# Patient Record
Sex: Female | Born: 1955 | Race: White | Hispanic: No | Smoking: Never smoker
Health system: Southern US, Community
[De-identification: ages and names within clinical notes are randomized; demographics above are authoritative.]

## PROBLEM LIST (undated history)

## (undated) DIAGNOSIS — I10 Essential (primary) hypertension: Secondary | ICD-10-CM

## (undated) DIAGNOSIS — E119 Type 2 diabetes mellitus without complications: Secondary | ICD-10-CM

---

## 1998-06-05 ENCOUNTER — Encounter: Payer: Self-pay | Admitting: Gynecology

## 1998-06-05 ENCOUNTER — Ambulatory Visit (HOSPITAL_COMMUNITY): Admission: RE | Admit: 1998-06-05 | Discharge: 1998-06-05 | Payer: Self-pay | Admitting: Gynecology

## 1999-01-21 ENCOUNTER — Ambulatory Visit (HOSPITAL_COMMUNITY): Admission: RE | Admit: 1999-01-21 | Discharge: 1999-01-21 | Payer: Self-pay | Admitting: Psychiatry

## 1999-02-02 ENCOUNTER — Ambulatory Visit (HOSPITAL_COMMUNITY): Admission: RE | Admit: 1999-02-02 | Discharge: 1999-02-02 | Payer: Self-pay | Admitting: Psychiatry

## 1999-02-04 ENCOUNTER — Ambulatory Visit (HOSPITAL_COMMUNITY): Admission: RE | Admit: 1999-02-04 | Discharge: 1999-02-04 | Payer: Self-pay | Admitting: Psychiatry

## 1999-02-11 ENCOUNTER — Ambulatory Visit (HOSPITAL_COMMUNITY): Admission: RE | Admit: 1999-02-11 | Discharge: 1999-02-11 | Payer: Self-pay | Admitting: Psychiatry

## 1999-03-04 ENCOUNTER — Inpatient Hospital Stay (HOSPITAL_COMMUNITY): Admission: RE | Admit: 1999-03-04 | Discharge: 1999-03-05 | Payer: Self-pay | Admitting: Neurosurgery

## 1999-03-04 ENCOUNTER — Encounter: Payer: Self-pay | Admitting: Neurosurgery

## 1999-06-09 ENCOUNTER — Ambulatory Visit (HOSPITAL_COMMUNITY): Admission: RE | Admit: 1999-06-09 | Discharge: 1999-06-09 | Payer: Self-pay | Admitting: Neurosurgery

## 1999-06-09 ENCOUNTER — Encounter: Payer: Self-pay | Admitting: Neurosurgery

## 2000-11-14 ENCOUNTER — Other Ambulatory Visit: Admission: RE | Admit: 2000-11-14 | Discharge: 2000-11-14 | Payer: Self-pay | Admitting: Gynecology

## 2013-12-05 ENCOUNTER — Encounter (HOSPITAL_COMMUNITY)
Admission: EM | Disposition: E | Payer: Medicare Other | Source: Other Acute Inpatient Hospital | Attending: Internal Medicine

## 2013-12-05 ENCOUNTER — Encounter (HOSPITAL_COMMUNITY): Payer: Self-pay | Admitting: Adult Health

## 2013-12-05 ENCOUNTER — Inpatient Hospital Stay (HOSPITAL_COMMUNITY): Payer: Medicare Other

## 2013-12-05 ENCOUNTER — Other Ambulatory Visit: Payer: Self-pay

## 2013-12-05 DIAGNOSIS — J9600 Acute respiratory failure, unspecified whether with hypoxia or hypercapnia: Secondary | ICD-10-CM | POA: Diagnosis not present

## 2013-12-05 DIAGNOSIS — R9431 Abnormal electrocardiogram [ECG] [EKG]: Secondary | ICD-10-CM

## 2013-12-05 DIAGNOSIS — E872 Acidosis, unspecified: Secondary | ICD-10-CM | POA: Diagnosis present

## 2013-12-05 DIAGNOSIS — Z66 Do not resuscitate: Secondary | ICD-10-CM | POA: Diagnosis present

## 2013-12-05 DIAGNOSIS — I214 Non-ST elevation (NSTEMI) myocardial infarction: Principal | ICD-10-CM

## 2013-12-05 DIAGNOSIS — A419 Sepsis, unspecified organism: Secondary | ICD-10-CM | POA: Diagnosis not present

## 2013-12-05 DIAGNOSIS — J690 Pneumonitis due to inhalation of food and vomit: Secondary | ICD-10-CM | POA: Diagnosis present

## 2013-12-05 DIAGNOSIS — G931 Anoxic brain damage, not elsewhere classified: Secondary | ICD-10-CM | POA: Diagnosis present

## 2013-12-05 DIAGNOSIS — E876 Hypokalemia: Secondary | ICD-10-CM | POA: Diagnosis present

## 2013-12-05 DIAGNOSIS — R6521 Severe sepsis with septic shock: Secondary | ICD-10-CM

## 2013-12-05 DIAGNOSIS — G9340 Encephalopathy, unspecified: Secondary | ICD-10-CM | POA: Diagnosis present

## 2013-12-05 DIAGNOSIS — Z79899 Other long term (current) drug therapy: Secondary | ICD-10-CM

## 2013-12-05 DIAGNOSIS — E870 Hyperosmolality and hypernatremia: Secondary | ICD-10-CM | POA: Diagnosis present

## 2013-12-05 DIAGNOSIS — J9601 Acute respiratory failure with hypoxia: Secondary | ICD-10-CM | POA: Diagnosis present

## 2013-12-05 DIAGNOSIS — I469 Cardiac arrest, cause unspecified: Secondary | ICD-10-CM

## 2013-12-05 DIAGNOSIS — G9382 Brain death: Secondary | ICD-10-CM | POA: Diagnosis present

## 2013-12-05 DIAGNOSIS — N179 Acute kidney failure, unspecified: Secondary | ICD-10-CM | POA: Diagnosis present

## 2013-12-05 DIAGNOSIS — I472 Ventricular tachycardia, unspecified: Secondary | ICD-10-CM | POA: Diagnosis present

## 2013-12-05 DIAGNOSIS — IMO0001 Reserved for inherently not codable concepts without codable children: Secondary | ICD-10-CM | POA: Diagnosis present

## 2013-12-05 DIAGNOSIS — E1165 Type 2 diabetes mellitus with hyperglycemia: Secondary | ICD-10-CM

## 2013-12-05 DIAGNOSIS — I4729 Other ventricular tachycardia: Secondary | ICD-10-CM | POA: Diagnosis present

## 2013-12-05 DIAGNOSIS — R570 Cardiogenic shock: Secondary | ICD-10-CM | POA: Diagnosis present

## 2013-12-05 DIAGNOSIS — I2510 Atherosclerotic heart disease of native coronary artery without angina pectoris: Secondary | ICD-10-CM | POA: Diagnosis present

## 2013-12-05 DIAGNOSIS — I10 Essential (primary) hypertension: Secondary | ICD-10-CM | POA: Diagnosis present

## 2013-12-05 HISTORY — PX: LEFT HEART CATHETERIZATION WITH CORONARY ANGIOGRAM: SHX5451

## 2013-12-05 HISTORY — DX: Type 2 diabetes mellitus without complications: E11.9

## 2013-12-05 HISTORY — DX: Essential (primary) hypertension: I10

## 2013-12-05 LAB — GLUCOSE, CAPILLARY
GLUCOSE-CAPILLARY: 430 mg/dL — AB (ref 70–99)
GLUCOSE-CAPILLARY: 417 mg/dL — AB (ref 70–99)
Glucose-Capillary: 404 mg/dL — ABNORMAL HIGH (ref 70–99)
Glucose-Capillary: 390 mg/dL — ABNORMAL HIGH (ref 70–99)
Glucose-Capillary: 458 mg/dL — ABNORMAL HIGH (ref 70–99)
Glucose-Capillary: 426 mg/dL — ABNORMAL HIGH (ref 70–99)
Glucose-Capillary: 380 mg/dL — ABNORMAL HIGH (ref 70–99)
Glucose-Capillary: 345 mg/dL — ABNORMAL HIGH (ref 70–99)
Glucose-Capillary: 281 mg/dL — ABNORMAL HIGH (ref 70–99)
Glucose-Capillary: 267 mg/dL — ABNORMAL HIGH (ref 70–99)

## 2013-12-05 LAB — BASIC METABOLIC PANEL
Anion gap: 32 — ABNORMAL HIGH (ref 5–15)
Anion gap: 31 — ABNORMAL HIGH (ref 5–15)
Anion gap: 27 — ABNORMAL HIGH (ref 5–15)
BUN: 14 mg/dL (ref 6–23)
BUN: 13 mg/dL (ref 6–23)
BUN: 13 mg/dL (ref 6–23)
CHLORIDE: 106 meq/L (ref 96–112)
CHLORIDE: 110 meq/L (ref 96–112)
CO2: 12 meq/L — AB (ref 19–32)
CO2: 11 meq/L — AB (ref 19–32)
CO2: 12 meq/L — AB (ref 19–32)
Calcium: 7.3 mg/dL — ABNORMAL LOW (ref 8.4–10.5)
Calcium: 6.9 mg/dL — ABNORMAL LOW (ref 8.4–10.5)
Calcium: 6.9 mg/dL — ABNORMAL LOW (ref 8.4–10.5)
Chloride: 104 mEq/L (ref 96–112)
Creatinine, Ser: 1.07 mg/dL (ref 0.50–1.10)
Creatinine, Ser: 1.07 mg/dL (ref 0.50–1.10)
Creatinine, Ser: 1.09 mg/dL (ref 0.50–1.10)
GFR calc Af Amer: 66 mL/min — ABNORMAL LOW (ref >90)
GFR calc Af Amer: 66 mL/min — ABNORMAL LOW (ref >90)
GFR calc Af Amer: 65 mL/min — ABNORMAL LOW (ref >90)
GFR calc non Af Amer: 57 mL/min — ABNORMAL LOW (ref >90)
GFR calc non Af Amer: 57 mL/min — ABNORMAL LOW (ref >90)
GFR calc non Af Amer: 56 mL/min — ABNORMAL LOW (ref >90)
GLUCOSE: 509 mg/dL — AB (ref 70–99)
GLUCOSE: 443 mg/dL — AB (ref 70–99)
GLUCOSE: 346 mg/dL — AB (ref 70–99)
POTASSIUM: 2.8 meq/L — AB (ref 3.7–5.3)
POTASSIUM: 3 meq/L — AB (ref 3.7–5.3)
POTASSIUM: 3.3 meq/L — AB (ref 3.7–5.3)
SODIUM: 148 meq/L — AB (ref 137–147)
SODIUM: 148 meq/L — AB (ref 137–147)
SODIUM: 149 meq/L — AB (ref 137–147)

## 2013-12-05 LAB — URINALYSIS, ROUTINE W REFLEX MICROSCOPIC
Bilirubin Urine: NEGATIVE
Glucose, UA: >1000 mg/dL — AB
Ketones, ur: 15 mg/dL — AB
Leukocytes, UA: NEGATIVE
NITRITE: NEGATIVE
Protein, ur: NEGATIVE mg/dL
Specific Gravity, Urine: 1.017 (ref 1.005–1.030)
UROBILINOGEN UA: 0.2 mg/dL (ref 0.0–1.0)
pH: 5.5 (ref 5.0–8.0)

## 2013-12-05 LAB — TROPONIN I
Troponin I: >20 ng/mL (ref <0.30)
Troponin I: >20 ng/mL (ref <0.30)

## 2013-12-05 LAB — COMPREHENSIVE METABOLIC PANEL
ALT: 240 U/L — AB (ref 0–35)
ANION GAP: 27 — AB (ref 5–15)
AST: 541 U/L — ABNORMAL HIGH (ref 0–37)
Albumin: 3.3 g/dL — ABNORMAL LOW (ref 3.5–5.2)
Alkaline Phosphatase: 148 U/L — ABNORMAL HIGH (ref 39–117)
BUN: 13 mg/dL (ref 6–23)
CALCIUM: 6.9 mg/dL — AB (ref 8.4–10.5)
CO2: 14 mEq/L — ABNORMAL LOW (ref 19–32)
Chloride: 104 mEq/L (ref 96–112)
Creatinine, Ser: 1.07 mg/dL (ref 0.50–1.10)
GFR calc Af Amer: 66 mL/min — ABNORMAL LOW (ref >90)
GFR calc non Af Amer: 57 mL/min — ABNORMAL LOW (ref >90)
GLUCOSE: 455 mg/dL — AB (ref 70–99)
POTASSIUM: 3 meq/L — AB (ref 3.7–5.3)
SODIUM: 145 meq/L (ref 137–147)
TOTAL PROTEIN: 5.6 g/dL — AB (ref 6.0–8.3)
Total Bilirubin: 0.4 mg/dL (ref 0.3–1.2)

## 2013-12-05 LAB — CBC
HCT: 48.8 % — ABNORMAL HIGH (ref 36.0–46.0)
HEMOGLOBIN: 16.4 g/dL — AB (ref 12.0–15.0)
MCH: 32.2 pg (ref 26.0–34.0)
MCHC: 33.6 g/dL (ref 30.0–36.0)
MCV: 95.7 fL (ref 78.0–100.0)
Platelets: 225 10*3/uL (ref 150–400)
RBC: 5.1 MIL/uL (ref 3.87–5.11)
RDW: 12.6 % (ref 11.5–15.5)
WBC: 25.8 10*3/uL — ABNORMAL HIGH (ref 4.0–10.5)

## 2013-12-05 LAB — RAPID URINE DRUG SCREEN, HOSP PERFORMED
AMPHETAMINES: NOT DETECTED
BENZODIAZEPINES: POSITIVE — AB
Barbiturates: NOT DETECTED
Cocaine: NOT DETECTED
OPIATES: NOT DETECTED
TETRAHYDROCANNABINOL: NOT DETECTED

## 2013-12-05 LAB — URINE MICROSCOPIC-ADD ON

## 2013-12-05 LAB — POCT I-STAT 3, ART BLOOD GAS (G3+)
ACID-BASE DEFICIT: 10 mmol/L — AB (ref 0.0–2.0)
Bicarbonate: 15.9 mEq/L — ABNORMAL LOW (ref 20.0–24.0)
O2 Saturation: 100 %
TCO2: 17 mmol/L (ref 0–100)
pCO2 arterial: 33.7 mmHg — ABNORMAL LOW (ref 35.0–45.0)
pH, Arterial: 7.282 — ABNORMAL LOW (ref 7.350–7.450)
pO2, Arterial: 267 mmHg — ABNORMAL HIGH (ref 80.0–100.0)

## 2013-12-05 LAB — PROTIME-INR
INR: 1.45 (ref 0.00–1.49)
INR: 1.32 (ref 0.00–1.49)
PROTHROMBIN TIME: 16.4 s — AB (ref 11.6–15.2)
Prothrombin Time: 17.6 seconds — ABNORMAL HIGH (ref 11.6–15.2)

## 2013-12-05 LAB — PHOSPHORUS: PHOSPHORUS: 2.6 mg/dL (ref 2.3–4.6)

## 2013-12-05 LAB — MRSA PCR SCREENING: MRSA BY PCR: NEGATIVE

## 2013-12-05 LAB — APTT
APTT: 134 s — AB (ref 24–37)
APTT: 81 s — AB (ref 24–37)

## 2013-12-05 LAB — LACTIC ACID, PLASMA
LACTIC ACID, VENOUS: 9.7 mmol/L — AB (ref 0.5–2.2)
Lactic Acid, Venous: 12.2 mmol/L — ABNORMAL HIGH (ref 0.5–2.2)

## 2013-12-05 LAB — MAGNESIUM: Magnesium: 2.2 mg/dL (ref 1.5–2.5)

## 2013-12-05 LAB — HEMOGLOBIN A1C
Hgb A1c MFr Bld: 6.8 % — ABNORMAL HIGH (ref <5.7)
Mean Plasma Glucose: 148 mg/dL — ABNORMAL HIGH (ref <117)

## 2013-12-05 LAB — HEPARIN LEVEL (UNFRACTIONATED): Heparin Unfractionated: 0.57 IU/mL (ref 0.30–0.70)

## 2013-12-05 LAB — PRO B NATRIURETIC PEPTIDE: PRO B NATRI PEPTIDE: 990.1 pg/mL — AB (ref 0–125)

## 2013-12-05 SURGERY — LEFT HEART CATHETERIZATION WITH CORONARY ANGIOGRAM
Anesthesia: LOCAL

## 2013-12-05 MED ORDER — INSULIN ASPART 100 UNIT/ML ~~LOC~~ SOLN: 0.0000 [IU] | SUBCUTANEOUS | Status: DC

## 2013-12-05 MED ORDER — POTASSIUM CHLORIDE 20 MEQ/15ML (10%) PO LIQD
40.0000 meq | Freq: Once | ORAL | Status: AC
  Administered 2013-12-05: 40 meq via ORAL
  Filled 2013-12-05: qty 30

## 2013-12-05 MED ORDER — BIOTENE DRY MOUTH MT LIQD
15.0000 mL | Freq: Four times a day (QID) | OROMUCOSAL | Status: DC
  Administered 2013-12-05 – 2013-12-07 (×8): 15 mL via OROMUCOSAL

## 2013-12-05 MED ORDER — TICAGRELOR 90 MG PO TABS
180.0000 mg | ORAL_TABLET | Freq: Once | ORAL | Status: AC
  Administered 2013-12-05: 180 mg via ORAL
  Filled 2013-12-05: qty 2

## 2013-12-05 MED ORDER — HEPARIN SODIUM (PORCINE) 1000 UNIT/ML IJ SOLN
4000.0000 [IU] | Freq: Once | INTRAMUSCULAR | Status: AC
  Administered 2013-12-05: 4000 [IU] via INTRAVENOUS
  Filled 2013-12-05: qty 4

## 2013-12-05 MED ORDER — SODIUM CHLORIDE 0.9 % IV SOLN
INTRAVENOUS | Status: DC
  Administered 2013-12-05: 12:00:00 via INTRAVENOUS

## 2013-12-05 MED ORDER — NOREPINEPHRINE BITARTRATE 1 MG/ML IV SOLN
0.5000 ug/min | INTRAVENOUS | Status: DC
  Administered 2013-12-05: 15 ug/min via INTRAVENOUS
  Administered 2013-12-06: 5 ug/min via INTRAVENOUS
  Administered 2013-12-06 – 2013-12-07 (×3): 50 ug/min via INTRAVENOUS
  Filled 2013-12-05 (×6): qty 16

## 2013-12-05 MED ORDER — SODIUM CHLORIDE 0.9 % IV SOLN
25.0000 ug/h | INTRAVENOUS | Status: DC
  Administered 2013-12-05: 50 ug/h via INTRAVENOUS
  Filled 2013-12-05: qty 50

## 2013-12-05 MED ORDER — SODIUM CHLORIDE 0.9 % IV SOLN
INTRAVENOUS | Status: DC
  Administered 2013-12-05: 17:00:00 via INTRAVENOUS

## 2013-12-05 MED ORDER — ASPIRIN 300 MG RE SUPP
300.0000 mg | RECTAL | Status: DC
  Filled 2013-12-05: qty 1

## 2013-12-05 MED ORDER — POTASSIUM CHLORIDE 10 MEQ/50ML IV SOLN
10.0000 meq | INTRAVENOUS | Status: AC
  Administered 2013-12-05 (×4): 10 meq via INTRAVENOUS
  Filled 2013-12-05 (×4): qty 50

## 2013-12-05 MED ORDER — ASPIRIN 81 MG PO CHEW
324.0000 mg | CHEWABLE_TABLET | Freq: Once | ORAL | Status: AC
  Administered 2013-12-05: 324 mg
  Filled 2013-12-05: qty 4

## 2013-12-05 MED ORDER — VANCOMYCIN HCL 10 G IV SOLR
2000.0000 mg | Freq: Once | INTRAVENOUS | Status: AC
  Administered 2013-12-05: 2000 mg via INTRAVENOUS
  Filled 2013-12-05: qty 2000

## 2013-12-05 MED ORDER — CHLORHEXIDINE GLUCONATE 0.12 % MT SOLN
15.0000 mL | Freq: Two times a day (BID) | OROMUCOSAL | Status: DC
  Administered 2013-12-05 – 2013-12-07 (×5): 15 mL via OROMUCOSAL
  Filled 2013-12-05 (×5): qty 15

## 2013-12-05 MED ORDER — FENTANYL BOLUS VIA INFUSION
50.0000 ug | INTRAVENOUS | Status: DC | PRN
  Filled 2013-12-05: qty 50

## 2013-12-05 MED ORDER — FENTANYL CITRATE 0.05 MG/ML IJ SOLN: 100.0000 ug | Freq: Once | INTRAMUSCULAR | Status: DC

## 2013-12-05 MED ORDER — NOREPINEPHRINE BITARTRATE 1 MG/ML IV SOLN
0.5000 ug/min | INTRAVENOUS | Status: DC
  Administered 2013-12-05: 15 ug/min via INTRAVENOUS
  Filled 2013-12-05: qty 4

## 2013-12-05 MED ORDER — MIDAZOLAM HCL 5 MG/ML IJ SOLN: 2.0000 mg | Freq: Once | INTRAMUSCULAR | Status: DC

## 2013-12-05 MED ORDER — ASPIRIN 81 MG PO CHEW
81.0000 mg | CHEWABLE_TABLET | Freq: Every day | ORAL | Status: DC
  Administered 2013-12-06: 81 mg via NASOGASTRIC
  Filled 2013-12-05 (×3): qty 1

## 2013-12-05 MED ORDER — FENTANYL CITRATE 0.05 MG/ML IJ SOLN: 100.0000 ug | INTRAMUSCULAR | Status: DC | PRN

## 2013-12-05 MED ORDER — HEPARIN BOLUS VIA INFUSION: 4000.0000 [IU] | Freq: Once | INTRAVENOUS | Status: DC

## 2013-12-05 MED ORDER — HEPARIN (PORCINE) IN NACL 100-0.45 UNIT/ML-% IJ SOLN
1100.0000 [IU]/h | INTRAMUSCULAR | Status: DC
  Administered 2013-12-05: 1100 [IU]/h via INTRAVENOUS
  Filled 2013-12-05 (×2): qty 250

## 2013-12-05 MED ORDER — SODIUM CHLORIDE 0.9 % IV SOLN
INTRAVENOUS | Status: DC
Start: 2013-12-05 — End: 2013-12-07
  Administered 2013-12-05: 12:00:00 via INTRAVENOUS

## 2013-12-05 MED ORDER — POTASSIUM CHLORIDE 20 MEQ/15ML (10%) PO LIQD
40.0000 meq | Freq: Every day | ORAL | Status: DC
  Administered 2013-12-05 – 2013-12-06 (×2): 40 meq via ORAL
  Filled 2013-12-05 (×4): qty 30

## 2013-12-05 MED ORDER — HEPARIN SODIUM (PORCINE) 5000 UNIT/ML IJ SOLN
5000.0000 [IU] | Freq: Three times a day (TID) | INTRAMUSCULAR | Status: DC
  Filled 2013-12-05 (×3): qty 1

## 2013-12-05 MED ORDER — VANCOMYCIN HCL IN DEXTROSE 1-5 GM/200ML-% IV SOLN
1000.0000 mg | Freq: Two times a day (BID) | INTRAVENOUS | Status: DC
  Administered 2013-12-06 – 2013-12-07 (×3): 1000 mg via INTRAVENOUS
  Filled 2013-12-05 (×4): qty 200

## 2013-12-05 MED ORDER — NITROGLYCERIN 0.2 MG/ML ON CALL CATH LAB
INTRAVENOUS | Status: AC
Start: 2013-12-05 — End: 2013-12-05
  Filled 2013-12-05: qty 1

## 2013-12-05 MED ORDER — SODIUM CHLORIDE 0.9 % IV SOLN
2000.0000 mL | Freq: Once | INTRAVENOUS | Status: AC
  Administered 2013-12-05: 10 mL/h via INTRAVENOUS

## 2013-12-05 MED ORDER — INSULIN REGULAR HUMAN 100 UNIT/ML IJ SOLN
INTRAMUSCULAR | Status: DC
  Administered 2013-12-05: 20 [IU]/h via INTRAVENOUS
  Administered 2013-12-05: 3.3 [IU]/h via INTRAVENOUS
  Administered 2013-12-06: 30.7 [IU]/h via INTRAVENOUS
  Administered 2013-12-07: 5.6 [IU]/h via INTRAVENOUS
  Filled 2013-12-05 (×6): qty 1

## 2013-12-05 MED ORDER — HEPARIN (PORCINE) IN NACL 2-0.9 UNIT/ML-% IJ SOLN
INTRAMUSCULAR | Status: AC
  Filled 2013-12-05: qty 1000

## 2013-12-05 MED ORDER — AMPICILLIN-SULBACTAM SODIUM 3 (2-1) G IJ SOLR
3.0000 g | Freq: Four times a day (QID) | INTRAMUSCULAR | Status: DC
  Administered 2013-12-05 – 2013-12-07 (×7): 3 g via INTRAVENOUS
  Filled 2013-12-05 (×11): qty 3

## 2013-12-05 MED ORDER — ASPIRIN 81 MG PO CHEW
CHEWABLE_TABLET | ORAL | Status: AC
  Filled 2013-12-05: qty 2

## 2013-12-05 MED ORDER — SODIUM CHLORIDE 0.9 % IV SOLN
1.0000 ug/kg/min | INTRAVENOUS | Status: DC
  Administered 2013-12-05: 0.5 ug/kg/min via INTRAVENOUS
  Filled 2013-12-05: qty 20

## 2013-12-05 MED ORDER — MIDAZOLAM BOLUS VIA INFUSION
2.0000 mg | INTRAVENOUS | Status: DC | PRN
  Filled 2013-12-05: qty 2

## 2013-12-05 MED ORDER — SODIUM CHLORIDE 0.9 % IV SOLN
1.0000 mL/kg/h | INTRAVENOUS | Status: AC
  Administered 2013-12-05: 1 mL/kg/h via INTRAVENOUS

## 2013-12-05 MED ORDER — CISATRACURIUM BOLUS VIA INFUSION
0.1000 mg/kg | Freq: Once | INTRAVENOUS | Status: AC
  Administered 2013-12-05: 12.9 mg via INTRAVENOUS
  Filled 2013-12-05: qty 13

## 2013-12-05 MED ORDER — CISATRACURIUM BOLUS VIA INFUSION
0.0500 mg/kg | INTRAVENOUS | Status: DC | PRN
  Filled 2013-12-05: qty 7

## 2013-12-05 MED ORDER — ARTIFICIAL TEARS OP OINT
1.0000 "application " | TOPICAL_OINTMENT | Freq: Three times a day (TID) | OPHTHALMIC | Status: DC
  Administered 2013-12-05 – 2013-12-07 (×6): 1 via OPHTHALMIC
  Filled 2013-12-05: qty 3.5

## 2013-12-05 MED ORDER — SODIUM CHLORIDE 0.9 % IV SOLN
1.0000 mg/h | INTRAVENOUS | Status: DC
  Administered 2013-12-05 – 2013-12-06 (×2): 2 mg/h via INTRAVENOUS
  Filled 2013-12-05 (×2): qty 10

## 2013-12-05 MED ORDER — LIDOCAINE HCL (PF) 1 % IJ SOLN
INTRAMUSCULAR | Status: AC
  Filled 2013-12-05: qty 30

## 2013-12-05 MED ORDER — MIDAZOLAM HCL 5 MG/ML IJ SOLN: 2.0000 mg | Freq: Once | INTRAMUSCULAR | Status: AC | PRN

## 2013-12-05 MED FILL — Water For Injection: INTRAMUSCULAR | Qty: 10 | Status: AC

## 2013-12-05 MED FILL — Vecuronium Bromide For Inj 10 MG: INTRAVENOUS | Qty: 10 | Status: AC

## 2013-12-05 NOTE — CV Procedure (Signed)
   Cardiac Catheterization Procedure Note  Name: Christine Keith MRN: [id number] DOB: 1955/11/08  Procedure: Left Heart Cath, Selective Coronary Angiography, LV angiography  Indication: 58 yo WF s/p out of hospital cardiac arrest with resuscitation. Elevated troponin and marked ST depression on ECG.    Procedural details: The right groin was prepped, draped, and anesthetized with 1% lidocaine. Using modified Seldinger technique, a 5 French sheath was exchanged for the 20 gauge arterial line in the right femoral artery. Standard Judkins catheters were used for coronary angiography and left ventriculography. Catheter exchanges were performed over a guidewire. There were no immediate procedural complications. The patient was transferred to the post catheterization recovery area for further monitoring.  Procedural Findings: Hemodynamics:  AO 114/77 mean 92 mm Hg LV 109/17 mm Hg   Coronary angiography: Coronary dominance: right  Left mainstem: Normal.  Left anterior descending (LAD): Normal. There is a large first diagonal branch that is normal.  Left circumflex (LCx): The LCx gives rise to 2 OM branches on the lateral wall. The first OM tapers in the mid vessel and is occluded distally. The second OM is without disease.  Right coronary artery (RCA): Normal.   Left ventriculography: Left ventricular systolic function is abnormal. There is a focal area of akinesis in the mid inferolateral wall. There is global hypokineis.  LVEF is estimated at 45%, there is no significant mitral regurgitation   Final Conclusions:   1. 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  2. Mild LV dysfunction.  Recommendations: Continue supportive care per CCM. Will heparinize IV and leave arterial sheath in place. If radial arterial line can be placed would DC IV heparin and remove femoral sheath.  Peter SwazilandJordan, MDFACC 12/06/2013, 1:59 PM

## 2013-12-05 NOTE — H&P (Signed)
PULMONARY / CRITICAL CARE MEDICINE   Name: Christine Keith MRN: [id number] DOB: [birth year]    ADMISSION DATE:  12/06/2013  [facility]  PRIMARY SERVICE: PCCM  CHIEF COMPLAINT:  Post arrest   BRIEF PATIENT DESCRIPTION: 58 yo female with hx DM, HTN [facility] arrest with 20-25 min downtime.  Initial troponin 9 with ST depression.  [facility] cardiac cath.    SIGNIFICANT EVENTS / STUDIES:  7/9 Tx from [facility] post cardiac arrest, bystander CPR, hypothermia protocol  7/9 cardiac cath>>>  LINES / TUBES: ETT 7/9>>> R IJ CVL 7/9>>> R fem aline 7/9>>>  CULTURES:  ANTIBIOTICS: none  HISTORY OF PRESENT ILLNESS:  58 yo female with hx DM, HTN found down at home pulseless by husband.  Husband performed CPR.  Total downtime approx 25 mins with [facility]  Initial troponin 9 with ST depression.  Per husband was c/o chest and arm pain for several days prior to arrest.  Tx to [facility] for hypothermia protocol, cardiac cath.     PAST MEDICAL HISTORY :  Past Medical History  Diagnosis Date  . HTN (hypertension)   . DM (diabetes mellitus)    No past surgical history on file. Prior to Admission medications   Not on File   No Known Allergies  FAMILY HISTORY:  No family history on file. SOCIAL HISTORY:  has no tobacco, alcohol, and drug history on file.  REVIEW OF SYSTEMS:  Unable   VITAL SIGNS: Weight:  [285 lb (129.275 kg)] 285 lb (129.275 kg) (07/09 1100) HEMODYNAMICS:   VENTILATOR SETTINGS:   INTAKE / OUTPUT: Intake/Output   None     PHYSICAL EXAMINATION: General:  Obese female, critically ill  Neuro:  Sedated on vent, RASS -2 HEENT:  Mm dry, ETT Cardiovascular:  s1s2 tachy Lungs: resps even non labored on vent, diminished throughout, few scattered rhonchi  Abdomen:  Obese, soft, hypoactive bs  Musculoskeletal:  Cool and dry, scant BLE edema   LABS:  CBC No  results found for this basename: WBC, HGB, HCT, PLT,  in the last 168 hours Coag's No results found for this basename: APTT, INR,  in the last 168 hours BMET No results found for this basename: NA, K, CL, CO2, BUN, CREATININE, GLUCOSE,  in the last 168 hours Electrolytes No results found for this basename: CALCIUM, MG, PHOS,  in the last 168 hours Sepsis Markers No results found for this basename: LATICACIDVEN, PROCALCITON, O2SATVEN,  in the last 168 hours ABG No results found for this basename: PHART, PCO2ART, PO2ART,  in the last 168 hours Liver Enzymes No results found for this basename: AST, ALT, ALKPHOS, BILITOT, ALBUMIN,  in the last 168 hours Cardiac Enzymes No results found for this basename: TROPONINI, PROBNP,  in the last 168 hours Glucose No results found for this basename: GLUCAP,  in the last 168 hours  Imaging No results found.    ASSESSMENT / PLAN:  PULMONARY Acute respiratory failure Uncompensated metabolic acidosis At risk edema P:   Vent support - rr 35, peep 5, titrate FiO2  To dats 94% F/u ABG STAT STAT pcxr To 7 cc/kg, will need less MV whats at goal temp  CARDIOVASCULAR PEA arrest  STEMI/ACS - suspect inferior infarct  Cardiogenic shock  P:  To cath lab urgently per Dr. Excell Seltzerooper  Hypothermia protocol  Titrate pressors for MAP >80 per hypothermia protocol  Anticoagulation per cards  F/u  enzymes  Lactate Check CVP cortisol  RENAL Metabolic acidosis - presumed lactic R/o rta contribution from dM, type 4 AKI  P:   F/u chem  HCO3 given, no role unless K elevated, will NOT change outcome as infusion Replete electrolytes as needed  q2 chem while on hypothermia protocol- at risk cool diuresis No free water  GASTROINTESTINAL No active issue  P:   NPO Consider early TF  LFT  HEMATOLOGIC ACS P:  SQ heparin  Will defer further anticoagulation to cards  F/u cbc STAT  INFECTIOUS No indication active infection, at risk for infection  hypothermia P:   Monitor wbc, fever trend off abx  UA  ENDOCRINE Hx DM    P:   SSI cortisol  NEUROLOGIC AMS - post arrest, high risk anoxia P:   Sedation/ nmb per hypothermia protocol Assess mental status after rewarming and keep normothermia x 72 hrs   I have personally obtained a history, examined the patient, evaluated laboratory and imaging results, formulated the assessment and plan and placed orders. CRITICAL CARE: The patient is critically ill with multiple organ systems failure and requires high complexity decision making for assessment and support, frequent evaluation and titration of therapies, application of advanced monitoring technologies and extensive interpretation of multiple databases. Critical Care Time devoted to patient care services described in this note is 55 minutes.    Dirk Dress, NP 12/02/2013  12:09 PM Pager: [phone] or [phone]  *Care during the described time interval was provided by me and/or other providers on the critical care team. I have reviewed this patient's available data, including medical history, events of note, physical examination and test results as part of my evaluation.  Mcarthur Rossetti. Tyson Alias, MD, FACP Pgr: [phone] [facility]

## 2013-12-05 NOTE — Progress Notes (Signed)
Chaplain Note: Responded to family's request for chaplain visit. Family requested chaplain contact an Manfred Shirtspiscopal priest to perform last rites for the patient.  Chaplain advised family it may be morning before we can locate a minister for them. Will request follow up by day chaplain. Rutherford NailLeah Smith, Chaplain

## 2013-12-05 NOTE — Consult Note (Signed)
CARDIOLOGY CONSULT NOTE  Patient ID: Christine Keith, MRN: [id number], DOB/AGE: 56-26-1957 56 y.o. Admit date: 12/26/2013 Date of Consult: 11/27/2013  Primary Physician: Avon Gully, MD Primary Cardiologist: none Referring Physician: Dr Jeannine Kitten [facility] EDP)  Chief Complaint: OOH Cardiac Arrest Reason for Consultation: OOH Cardiac Arrest  HPI: 58 year old diabetic female who presented to [facility] hospital after an out of hospital cardiac arrest. The patient reportedly had chest discomfort for a few days last week. She did not seek medical attention. This morning at home she became unresponsive. EMS was called and her husband perform CPR. Upon EMS arrival, the patient was apparently in PEA. She also had a period of asystole. By report, there were multiple rhythm changes and she did have episodes of VT/VF and was defibrillated. As her arrest was witnessed, estimated time to BLS was short (less than 5 minutes). Total CPR time before return of spontaneous circulation was greater than 25 minutes. The patient was transferred here for further care.  There is no other history available at this time his family is not present. The patient is currently intubated and unresponsive. Therapeutic hypothermia has been initiated. CCM is placing arterial and venous lines.  Past Medical History  Diagnosis Date  . HTN (hypertension)   . DM (diabetes mellitus)       Surgical History: No past surgical history on file.   Home Meds: Prior to Admission medications   Not on File    Inpatient Medications:  . sodium chloride  2,000 mL Intravenous Once  . antiseptic oral rinse  15 mL Mouth Rinse QID  . artificial tears  1 application Both Eyes 3 times per day  . aspirin  300 mg Rectal NOW  . chlorhexidine  15 mL Mouth Rinse BID  . cisatracurium  0.1 mg/kg Intravenous Once  . fentaNYL  100 mcg Intravenous Once  . heparin  5,000 Units Subcutaneous 3 times per day  . insulin aspart  0-15 Units Subcutaneous 6  times per day  . midazolam  2 mg Intravenous Once  . ticagrelor  180 mg Oral Once   . cisatracurium (NIMBEX) infusion    . fentaNYL infusion INTRAVENOUS    . midazolam (VERSED) infusion    . norepinephrine (LEVOPHED) Adult infusion      Allergies: No Known Allergies  History   Social History  . Marital Status: Divorced    Spouse Name: N/A    Number of Children: N/A  . Years of Education: N/A   Occupational History  . Not on file.   Social History Main Topics  . Smoking status: Not on file  . Smokeless tobacco: Not on file  . Alcohol Use: Not on file  . Drug Use: Not on file  . Sexual Activity: Not on file   Other Topics Concern  . Not on file   Social History Narrative  . No narrative on file     No family history on file. family history is not obtainable at this time.  Review of Systems: Not obtainable at this time.  Physical Exam: Weight 129.275 kg (285 lb). General: Intubated, morbidly obese HEENT: Normocephalic, atraumatic, ET tube in place Neck: Carotids 2+ without bruits. JVP - unable to visualize Lungs: Clear bilaterally to auscultation without wheezes, rales, or rhonchi.  Heart: RRR with normal S1 and S2. No murmurs, rubs, or gallops appreciated. Abdomen: Soft, obese, bowel sounds present Back: No obvious deformity Msk:  Strength and tone appear normal for age. Extremities: No clubbing, cyanosis, or edema.  Distal pedal pulses are faint, skin is cool  Neuro: Unable to assess as patient unresponsive     Labs: No results found for this basename: CKTOTAL, CKMB, TROPONINI,  in the last 72 hours No results found for this basename: WBC, HGB, HCT, MCV, PLT   No results found for this basename: NA, K, CL, CO2, BUN, CREATININE, CALCIUM, LABALBU, PROT, BILITOT, ALKPHOS, ALT, AST, GLUCOSE,  in the last 168 hours No results found for this basename: CHOL, HDL, LDLCALC, TRIG   No results found for this basename: DDIMER   [facility].  Initial blood pH was 6.96, PCO2 44, PO2 103, bicarbonate 10. White blood cell count 9.7, hemoglobin 14.1, platelet count 176,000, INR 1.2, glucose 513, creatinine 1.1, calcium 7.7, troponin 9.6  Chest x-ray shows endotracheal tube in good position, mild bibasilar atelectasis, cardiac enlargement with vascular congestion, no edema or effusion. Radiology/Studies:  No results found.  EKG: Normal sinus rhythm with marked ST segment depression V1 through V3, consistent with severe subendocardial ischemia versus posterior infarction  Cardiac Studies: Pending  ASSESSMENT AND PLAN:  1. Out of hospital cardiac arrest 2. Non-ST elevation myocardial infarction, but concern for posterior MI 3. Shock, currently on norepinephrine drip 4. Uncontrolled diabetes 5. Morbid obesity  Reviewed case extensively [facility] and Dr Tyson AliasFeinstein with CCM. Considering her hemodynamic instability and marked ischemic changes on EKG, we plan to take her emergently for cardiac catheterization and possible PCI. Her neurologic outcome is certainly guarded considering out of hospital arrest with greater than 20 minutes of CPR. However, her arrest was witnessed with CPR initiated early, so early aggressive evaluation and treatment are indicated in this 58 patient. Further plans pending the results of her cardiac catheterization, she will be continued on maximum support with vasopressors, therapeutic hypothermia, and treatment of myocardial infarction. Will load with Brilinta 180 mg and give a heparin bolus 400 units now.  Enzo BiSigned, Candela Krul  12/27/2013, 12:17 PM

## 2013-12-05 NOTE — Progress Notes (Signed)
Utilization Review Completed.Christine Keith T7/01/2014  

## 2013-12-05 NOTE — Progress Notes (Signed)
eLink Physician-Brief Progress Note Patient Name: Christine Keith DOB: [birth year] MRN: [id number]  Date of Service  12/22/2013   HPI/Events of Note    Recent Labs Lab 12/23/2013 1255 12/26/2013 1600  NA 145 148*  K 3.0* 2.8*  CL 104 104  CO2 14* 12*  GLUCOSE 455* 509*  BUN 13 14  CREATININE 1.07 1.07  CALCIUM 6.9* 7.3*  MG 2.2  --   PHOS 2.6  --      eICU Interventions  Low K  - IV kcl 40meq z 1   Intervention Category Major Interventions: Electrolyte abnormality - evaluation and management  Christine Keith 12/16/2013, 5:23 PM

## 2013-12-05 NOTE — H&P (View-Only) (Signed)
CARDIOLOGY CONSULT NOTE  Patient ID: Christine Keith, MRN: [id number], DOB/AGE: 56-26-1957 56 y.o. Admit date: 11/29/2013 Date of Consult: 11/30/2013  Primary Physician: Avon Gully, MD Primary Cardiologist: none Referring Physician: Dr Jeannine Kitten [facility] EDP)  Chief Complaint: OOH Cardiac Arrest Reason for Consultation: OOH Cardiac Arrest  HPI: 58 year old diabetic female who presented to [facility] hospital after an out of hospital cardiac arrest. The patient reportedly had chest discomfort for a few days last week. She did not seek medical attention. This morning at home she became unresponsive. EMS was called and her husband perform CPR. Upon EMS arrival, the patient was apparently in PEA. She also had a period of asystole. By report, there were multiple rhythm changes and she did have episodes of VT/VF and was defibrillated. As her arrest was witnessed, estimated time to BLS was short (less than 5 minutes). Total CPR time before return of spontaneous circulation was greater than 25 minutes. The patient was transferred here for further care.  There is no other history available at this time his family is not present. The patient is currently intubated and unresponsive. Therapeutic hypothermia has been initiated. CCM is placing arterial and venous lines.  Past Medical History  Diagnosis Date  . HTN (hypertension)   . DM (diabetes mellitus)       Surgical History: No past surgical history on file.   Home Meds: Prior to Admission medications   Not on File    Inpatient Medications:  . sodium chloride  2,000 mL Intravenous Once  . antiseptic oral rinse  15 mL Mouth Rinse QID  . artificial tears  1 application Both Eyes 3 times per day  . aspirin  300 mg Rectal NOW  . chlorhexidine  15 mL Mouth Rinse BID  . cisatracurium  0.1 mg/kg Intravenous Once  . fentaNYL  100 mcg Intravenous Once  . heparin  5,000 Units Subcutaneous 3 times per day  . insulin aspart  0-15 Units Subcutaneous 6  times per day  . midazolam  2 mg Intravenous Once  . ticagrelor  180 mg Oral Once   . cisatracurium (NIMBEX) infusion    . fentaNYL infusion INTRAVENOUS    . midazolam (VERSED) infusion    . norepinephrine (LEVOPHED) Adult infusion      Allergies: No Known Allergies  History   Social History  . Marital Status: Divorced    Spouse Name: N/A    Number of Children: N/A  . Years of Education: N/A   Occupational History  . Not on file.   Social History Main Topics  . Smoking status: Not on file  . Smokeless tobacco: Not on file  . Alcohol Use: Not on file  . Drug Use: Not on file  . Sexual Activity: Not on file   Other Topics Concern  . Not on file   Social History Narrative  . No narrative on file     No family history on file. family history is not obtainable at this time.  Review of Systems: Not obtainable at this time.  Physical Exam: Weight 129.275 kg (285 lb). General: Intubated, morbidly obese HEENT: Normocephalic, atraumatic, ET tube in place Neck: Carotids 2+ without bruits. JVP - unable to visualize Lungs: Clear bilaterally to auscultation without wheezes, rales, or rhonchi.  Heart: RRR with normal S1 and S2. No murmurs, rubs, or gallops appreciated. Abdomen: Soft, obese, bowel sounds present Back: No obvious deformity Msk:  Strength and tone appear normal for age. Extremities: No clubbing, cyanosis, or edema.  Distal pedal pulses are faint, skin is cool  Neuro: Unable to assess as patient unresponsive     Labs: No results found for this basename: CKTOTAL, CKMB, TROPONINI,  in the last 72 hours No results found for this basename: WBC, HGB, HCT, MCV, PLT   No results found for this basename: NA, K, CL, CO2, BUN, CREATININE, CALCIUM, LABALBU, PROT, BILITOT, ALKPHOS, ALT, AST, GLUCOSE,  in the last 168 hours No results found for this basename: CHOL, HDL, LDLCALC, TRIG   No results found for this basename: DDIMER   L[facility].  Initial blood pH was 6.96, PCO2 44, PO2 103, bicarbonate 10. White blood cell count 9.7, hemoglobin 14.1, platelet count 176,000, INR 1.2, glucose 513, creatinine 1.1, calcium 7.7, troponin 9.6  Chest x-ray shows endotracheal tube in good position, mild bibasilar atelectasis, cardiac enlargement with vascular congestion, no edema or effusion. Radiology/Studies:  No results found.  EKG: Normal sinus rhythm with marked ST segment depression V1 through V3, consistent with severe subendocardial ischemia versus posterior infarction  Cardiac Studies: Pending  ASSESSMENT AND PLAN:  1. Out of hospital cardiac arrest 2. Non-ST elevation myocardial infarction, but concern for posterior MI 3. Shock, currently on norepinephrine drip 4. Uncontrolled diabetes 5. Morbid obesity  Reviewed case extensively with [facility] and Dr Tyson AliasFeinstein with CCM. Considering her hemodynamic instability and marked ischemic changes on EKG, we plan to take her emergently for cardiac catheterization and possible PCI. Her neurologic outcome is certainly guarded considering out of hospital arrest with greater than 20 minutes of CPR. However, her arrest was witnessed with CPR initiated early, so early aggressive evaluation and treatment are indicated in this young patient. Further plans pending the results of her cardiac catheterization, she will be continued on maximum support with vasopressors, therapeutic hypothermia, and treatment of myocardial infarction. Will load with Brilinta 180 mg and give a heparin bolus 400 units now.  Enzo BiSigned, Kabir Brannock  12/27/2013, 12:17 PM

## 2013-12-05 NOTE — Progress Notes (Signed)
Pt was transported to the cath lab. Cooling initiated at 1310 once pt was in the lab.  Core temp prior to transport was 33.4

## 2013-12-05 NOTE — Progress Notes (Signed)
Called Dr. Marchelle Gearingamaswamy to report K of 2.8.  New orders received.

## 2013-12-05 NOTE — Procedures (Signed)
Central Venous Catheter Insertion Procedure Note Christine Keith [id number] 09-Jul-1955  Procedure: Insertion of Central Venous Catheter Indications: Assessment of intravascular volume, Drug and/or fluid administration and Frequent blood sampling  Procedure Details Consent: Unable to obtain consent because of emergent medical necessity. Time Out: Verified patient identification, verified procedure, site/side was marked, verified correct patient position, special equipment/implants available, medications/allergies/relevent history reviewed, required imaging and test results available.  Performed  Maximum sterile technique was used including antiseptics, cap, gloves, gown, hand hygiene, mask and sheet. Skin prep: Chlorhexidine; local anesthetic administered A antimicrobial bonded/coated triple lumen catheter was placed in the right internal jugular vein using the Seldinger technique. Ultrasound guidance used.Yes.   Catheter placed to 17 cm. Blood aspirated via all 3 ports and then flushed x 3. Line sutured x 2 and dressing applied.  Evaluation Blood flow good Complications: No apparent complications Patient did tolerate procedure well. Chest X-ray ordered to verify placement.  CXR: pending.  Christine CanalesSteve Keith ACNP Christine PollackLe Keith PCCM Pager [phone] till 3 pm If no answer page [phone] 12/06/2013, 12:00 PM   emegrency US Shock, pressors Christine RossettiDaniel J. Tyson AliasFeinstein, MD, FACP Pgr: [phone] [facility]

## 2013-12-05 NOTE — Progress Notes (Addendum)
ANTICOAGULATION CONSULT NOTE - Initial Consult  Pharmacy Consult for heparin Indication: chest pain/ACS  No Known Allergies  Patient Measurements: Weight: 285 lb (129.275 kg) Heparin Dosing Weight: 110kg  Vital Signs: Temp: 94.6 F (34.8 C) (07/09 1310) Temp src: Core (Comment) (07/09 1310) BP: 53/22 mmHg (07/09 1230) Pulse Rate: 90 (07/09 1319)  Labs:  Recent Labs  07-30-2013 1255  HGB 16.4*  HCT 48.8*  PLT 225  CREATININE 1.07  TROPONINI >20.00*    CrCl is unknown because there is no height on file for the current visit.   Medical History: Past Medical History  Diagnosis Date  . HTN (hypertension)   . DM (diabetes mellitus)     Medications:  pending  Assessment: 58 year old female now on hypothermia protocol. S/p cath found to have single vessel occlusive CAD (OM1). Orders to start heparin post cath (received bolus prior to cath), may d/c if radial arterial line can be placed but will leave to physician discretion. Baseline cbc appears normal, renal function normal. Will start with conservative dosing given cath and hypothermia initiation ~10 units/kg/hr.   Goal of Therapy:  Heparin level 0.3-0.7 units/ml Monitor platelets by anticoagulation protocol: Yes   Plan:  Start heparin infusion at 1100 units/hr Check anti-Xa level in 6 hours and daily while on heparin Continue to monitor H&H and platelets  Sheppard CoilFrank Wilson PharmD., BCPS Clinical Pharmacist Pager [phone] 12/14/2013 2:40 PM  Addendum:  Received call to start broad empiric abx for possible sepsis/uti/aspiration. WBC elevated as noted above as well as LA.  Plan 1. unasyn 3g q6h 2. Vanc 2g iv now then 1g q12 hours 12/09/2013 2:52 PM

## 2013-12-05 NOTE — Interval H&P Note (Signed)
History and Physical Interval Note:  12/17/2013 1:19 PM  Neldon McDonna B Fruchter  has presented today for surgery, with the diagnosis of Urgent  The various methods of treatment have been discussed with the patient and family. After consideration of risks, benefits and other options for treatment, the patient has consented to  Procedure(s): LEFT HEART CATHETERIZATION WITH CORONARY ANGIOGRAM (N/A) as a surgical intervention .  The patient's history has been reviewed, patient examined, no change in status, stable for surgery.  I have reviewed the patient's chart and labs.  Questions were answered to the patient's satisfaction.   Cath Lab Visit (complete for each Cath Lab visit)  Clinical Evaluation Leading to the Procedure:   ACS: Yes.    Non-ACS:    Anginal Classification: CCS IV  Anti-ischemic medical therapy: No Therapy  Non-Invasive Test Results: No non-invasive testing performed  Prior CABG: No previous CABG        Theron AristaPeter Vidant Beaufort HospitalJordanMD,FACC 12/13/2013 1:19 PM

## 2013-12-05 NOTE — Procedures (Signed)
Arterial Catheter Insertion Procedure Note Neldon McDonna B Steffler [id number] Feb 13, 1956  Procedure: Insertion of Arterial Catheter  Indications: Blood pressure monitoring and Frequent blood sampling  Procedure Details Consent: Unable to obtain consent because of emergent medical necessity. Time Out: Verified patient identification, verified procedure, site/side was marked, verified correct patient position, special equipment/implants available, medications/allergies/relevent history reviewed, required imaging and test results available.  Performed  Maximum sterile technique was used including antiseptics, cap, gloves, gown, hand hygiene, mask and sheet. Skin prep: Chlorhexidine; local anesthetic administered 20 gauge catheter was inserted into right femoral artery using the Seldinger technique.  Evaluation Blood flow good; BP tracing good. Complications: No apparent complications.  Per. P. Hoffman ACNP Brett CanalesSteve Minor ACNP Adolph PollackLe Bauer PCCM Pager [phone] till 3 pm If no answer page [phone] 12/26/2013, 12:01 PM   No radials as for cath  [place]  Mcarthur RossettiDaniel J. Tyson AliasFeinstein, MD, FACP Pgr: [phone] [facility]

## 2013-12-05 NOTE — Progress Notes (Signed)
Critical troponin on greater than 20 reported to Dr. Tyson AliasFeinstein.  Orders already in place for heparin gtt per cards.

## 2013-12-06 ENCOUNTER — Inpatient Hospital Stay (HOSPITAL_COMMUNITY): Payer: Medicare Other

## 2013-12-06 LAB — BASIC METABOLIC PANEL
Anion gap: 28 — ABNORMAL HIGH (ref 5–15)
Anion gap: 22 — ABNORMAL HIGH (ref 5–15)
Anion gap: 20 — ABNORMAL HIGH (ref 5–15)
Anion gap: 21 — ABNORMAL HIGH (ref 5–15)
Anion gap: 21 — ABNORMAL HIGH (ref 5–15)
BUN: 12 mg/dL (ref 6–23)
BUN: 12 mg/dL (ref 6–23)
BUN: 12 mg/dL (ref 6–23)
BUN: 13 mg/dL (ref 6–23)
BUN: 15 mg/dL (ref 6–23)
CALCIUM: 7.2 mg/dL — AB (ref 8.4–10.5)
CALCIUM: 7.3 mg/dL — AB (ref 8.4–10.5)
CALCIUM: 7.3 mg/dL — AB (ref 8.4–10.5)
CALCIUM: 7.3 mg/dL — AB (ref 8.4–10.5)
CO2: 11 mEq/L — ABNORMAL LOW (ref 19–32)
CO2: 15 mEq/L — ABNORMAL LOW (ref 19–32)
CO2: 16 mEq/L — ABNORMAL LOW (ref 19–32)
CO2: 15 mEq/L — ABNORMAL LOW (ref 19–32)
CO2: 16 mEq/L — ABNORMAL LOW (ref 19–32)
CREATININE: 0.95 mg/dL (ref 0.50–1.10)
CREATININE: 0.88 mg/dL (ref 0.50–1.10)
CREATININE: 0.89 mg/dL (ref 0.50–1.10)
CREATININE: 1.12 mg/dL — AB (ref 0.50–1.10)
Calcium: 7.3 mg/dL — ABNORMAL LOW (ref 8.4–10.5)
Chloride: 110 mEq/L (ref 96–112)
Chloride: 113 mEq/L — ABNORMAL HIGH (ref 96–112)
Chloride: 110 mEq/L (ref 96–112)
Chloride: 111 mEq/L (ref 96–112)
Chloride: 108 mEq/L (ref 96–112)
Creatinine, Ser: 0.99 mg/dL (ref 0.50–1.10)
GFR calc Af Amer: 62 mL/min — ABNORMAL LOW (ref >90)
GFR calc non Af Amer: 63 mL/min — ABNORMAL LOW (ref >90)
GFR calc non Af Amer: 71 mL/min — ABNORMAL LOW (ref >90)
GFR, EST AFRICAN AMERICAN: 72 mL/min — AB (ref >90)
GFR, EST AFRICAN AMERICAN: 76 mL/min — AB (ref >90)
GFR, EST AFRICAN AMERICAN: 82 mL/min — AB (ref >90)
GFR, EST AFRICAN AMERICAN: 84 mL/min — AB (ref >90)
GFR, EST NON AFRICAN AMERICAN: 66 mL/min — AB (ref >90)
GFR, EST NON AFRICAN AMERICAN: 72 mL/min — AB (ref >90)
GFR, EST NON AFRICAN AMERICAN: 54 mL/min — AB (ref >90)
GLUCOSE: 202 mg/dL — AB (ref 70–99)
GLUCOSE: 231 mg/dL — AB (ref 70–99)
GLUCOSE: 255 mg/dL — AB (ref 70–99)
GLUCOSE: 270 mg/dL — AB (ref 70–99)
Glucose, Bld: 233 mg/dL — ABNORMAL HIGH (ref 70–99)
POTASSIUM: 2.9 meq/L — AB (ref 3.7–5.3)
POTASSIUM: 3.7 meq/L (ref 3.7–5.3)
Potassium: 2.8 mEq/L — CL (ref 3.7–5.3)
Potassium: 4.1 mEq/L (ref 3.7–5.3)
Potassium: 3.3 mEq/L — ABNORMAL LOW (ref 3.7–5.3)
SODIUM: 149 meq/L — AB (ref 137–147)
Sodium: 150 mEq/L — ABNORMAL HIGH (ref 137–147)
Sodium: 146 mEq/L (ref 137–147)
Sodium: 147 mEq/L (ref 137–147)
Sodium: 145 mEq/L (ref 137–147)

## 2013-12-06 LAB — GLUCOSE, CAPILLARY
GLUCOSE-CAPILLARY: 251 mg/dL — AB (ref 70–99)
GLUCOSE-CAPILLARY: 198 mg/dL — AB (ref 70–99)
GLUCOSE-CAPILLARY: 166 mg/dL — AB (ref 70–99)
GLUCOSE-CAPILLARY: 213 mg/dL — AB (ref 70–99)
GLUCOSE-CAPILLARY: 203 mg/dL — AB (ref 70–99)
GLUCOSE-CAPILLARY: 188 mg/dL — AB (ref 70–99)
Glucose-Capillary: 174 mg/dL — ABNORMAL HIGH (ref 70–99)
Glucose-Capillary: 189 mg/dL — ABNORMAL HIGH (ref 70–99)
Glucose-Capillary: 198 mg/dL — ABNORMAL HIGH (ref 70–99)
Glucose-Capillary: 205 mg/dL — ABNORMAL HIGH (ref 70–99)
Glucose-Capillary: 206 mg/dL — ABNORMAL HIGH (ref 70–99)
Glucose-Capillary: 206 mg/dL — ABNORMAL HIGH (ref 70–99)
Glucose-Capillary: 215 mg/dL — ABNORMAL HIGH (ref 70–99)
Glucose-Capillary: 223 mg/dL — ABNORMAL HIGH (ref 70–99)
Glucose-Capillary: 238 mg/dL — ABNORMAL HIGH (ref 70–99)
Glucose-Capillary: 251 mg/dL — ABNORMAL HIGH (ref 70–99)
Glucose-Capillary: 296 mg/dL — ABNORMAL HIGH (ref 70–99)

## 2013-12-06 LAB — PHOSPHORUS: Phosphorus: 0.8 mg/dL — CL (ref 2.3–4.6)

## 2013-12-06 LAB — BLOOD GAS, ARTERIAL
ACID-BASE DEFICIT: 12.2 mmol/L — AB (ref 0.0–2.0)
Bicarbonate: 13.4 mEq/L — ABNORMAL LOW (ref 20.0–24.0)
Drawn by: 41308
FIO2: 40 %
O2 Saturation: 97.7 %
PATIENT TEMPERATURE: 89.6
PEEP: 5 cmH2O
RATE: 35 resp/min
TCO2: 14.3 mmol/L (ref 0–100)
VT: 450 mL
pCO2 arterial: 23.8 mmHg — ABNORMAL LOW (ref 35.0–45.0)
pH, Arterial: 7.336 — ABNORMAL LOW (ref 7.350–7.450)
pO2, Arterial: 74.7 mmHg — ABNORMAL LOW (ref 80.0–100.0)

## 2013-12-06 LAB — TROPONIN I
Troponin I: >20 ng/mL (ref <0.30)
Troponin I: >20 ng/mL (ref <0.30)
Troponin I: >20 ng/mL (ref <0.30)

## 2013-12-06 LAB — CBC
HCT: 49.6 % — ABNORMAL HIGH (ref 36.0–46.0)
Hemoglobin: 17 g/dL — ABNORMAL HIGH (ref 12.0–15.0)
MCH: 32.1 pg (ref 26.0–34.0)
MCHC: 34.3 g/dL (ref 30.0–36.0)
MCV: 93.6 fL (ref 78.0–100.0)
PLATELETS: 181 10*3/uL (ref 150–400)
RBC: 5.3 MIL/uL — AB (ref 3.87–5.11)
RDW: 12.9 % (ref 11.5–15.5)
WBC: 24.1 10*3/uL — ABNORMAL HIGH (ref 4.0–10.5)

## 2013-12-06 LAB — RAPID URINE DRUG SCREEN, HOSP PERFORMED
AMPHETAMINES: NOT DETECTED
BENZODIAZEPINES: POSITIVE — AB
Barbiturates: NOT DETECTED
Cocaine: NOT DETECTED
OPIATES: NOT DETECTED
TETRAHYDROCANNABINOL: NOT DETECTED

## 2013-12-06 LAB — POCT I-STAT 3, ART BLOOD GAS (G3+)
Acid-base deficit: 11 mmol/L — ABNORMAL HIGH (ref 0.0–2.0)
Bicarbonate: 14.8 mEq/L — ABNORMAL LOW (ref 20.0–24.0)
O2 Saturation: 97 %
PCO2 ART: 26.5 mmHg — AB (ref 35.0–45.0)
PO2 ART: 84 mmHg (ref 80.0–100.0)
Patient temperature: 91.4
TCO2: 16 mmol/L (ref 0–100)
pH, Arterial: 7.336 — ABNORMAL LOW (ref 7.350–7.450)

## 2013-12-06 LAB — ACETAMINOPHEN LEVEL: Acetaminophen (Tylenol), Serum: <15 ug/mL (ref 10–30)

## 2013-12-06 LAB — CORTISOL: Cortisol, Plasma: 50.2 ug/dL

## 2013-12-06 LAB — POCT ACTIVATED CLOTTING TIME: ACTIVATED CLOTTING TIME: 101 s

## 2013-12-06 LAB — HEPARIN LEVEL (UNFRACTIONATED): Heparin Unfractionated: 0.85 IU/mL — ABNORMAL HIGH (ref 0.30–0.70)

## 2013-12-06 LAB — LIPASE, BLOOD: LIPASE: 13 U/L (ref 11–59)

## 2013-12-06 LAB — LACTATE DEHYDROGENASE: LDH: 1502 U/L — AB (ref 94–250)

## 2013-12-06 LAB — MAGNESIUM: MAGNESIUM: 1.7 mg/dL (ref 1.5–2.5)

## 2013-12-06 LAB — LACTIC ACID, PLASMA: Lactic Acid, Venous: 5.2 mmol/L — ABNORMAL HIGH (ref 0.5–2.2)

## 2013-12-06 LAB — AMYLASE: Amylase: 31 U/L (ref 0–105)

## 2013-12-06 LAB — SALICYLATE LEVEL: Salicylate Lvl: <2 mg/dL — ABNORMAL LOW (ref 2.8–20.0)

## 2013-12-06 MED ORDER — MAGNESIUM SULFATE 40 MG/ML IJ SOLN
2.0000 g | Freq: Once | INTRAMUSCULAR | Status: AC
  Administered 2013-12-06: 2 g via INTRAVENOUS
  Filled 2013-12-06: qty 50

## 2013-12-06 MED ORDER — POTASSIUM PHOSPHATES 15 MMOLE/5ML IV SOLN
30.0000 mmol | Freq: Once | INTRAVENOUS | Status: AC
  Administered 2013-12-06: 30 mmol via INTRAVENOUS
  Filled 2013-12-06: qty 10

## 2013-12-06 MED ORDER — HEPARIN SODIUM (PORCINE) 5000 UNIT/ML IJ SOLN: 5000.0000 [IU] | Freq: Three times a day (TID) | INTRAMUSCULAR | Status: DC

## 2013-12-06 MED ORDER — CLOPIDOGREL BISULFATE 75 MG PO TABS
75.0000 mg | ORAL_TABLET | Freq: Every day | ORAL | Status: DC
  Filled 2013-12-06: qty 1

## 2013-12-06 MED ORDER — VASOPRESSIN 20 UNIT/ML IJ SOLN
0.0300 [IU]/min | INTRAVENOUS | Status: DC
  Administered 2013-12-06: 0.03 [IU]/min via INTRAVENOUS
  Filled 2013-12-06 (×2): qty 2.5

## 2013-12-06 MED ORDER — POTASSIUM CHLORIDE 10 MEQ/50ML IV SOLN
10.0000 meq | INTRAVENOUS | Status: AC
Start: 2013-12-06 — End: 2013-12-06
  Administered 2013-12-06 (×4): 10 meq via INTRAVENOUS
  Filled 2013-12-06 (×4): qty 50

## 2013-12-06 MED ORDER — HEPARIN SODIUM (PORCINE) 5000 UNIT/ML IJ SOLN
5000.0000 [IU] | Freq: Three times a day (TID) | INTRAMUSCULAR | Status: DC
  Administered 2013-12-07 (×2): 5000 [IU] via SUBCUTANEOUS
  Filled 2013-12-06 (×6): qty 1

## 2013-12-06 NOTE — Progress Notes (Addendum)
Spoke with RN. Dr. Jens Somrenshaw requested femoral sheath be removed. However, access is limited and the sheath is crucial to her therapy and monitoring right now. Okay to leave the sheath in place.

## 2013-12-06 NOTE — Procedures (Signed)
I have had extensive discussions with family daughter. We discussed patients current circumstances and organ failures. We also discussed patient's prior wishes under circumstances such as this. Family has decided to NOT perform resuscitation if arrest but to continue current medical support for now. por outcome. Family will eval after rewarm and consider comfort after neuro exam. I am concerned herniation, which i described to them  Mcarthur Rossettianiel J. Tyson AliasFeinstein, MD, FACP Pgr: [phone] [facility]

## 2013-12-06 NOTE — Progress Notes (Signed)
Spoke with Dr. Tyson AliasFeinstein and new orders received to begin rewarming procedure early (now) due to patients hemodynamic instability.  Also received orders to rewarm at a rate of .5 degrees C/per hour.  [facility] verified per 2 RN's.  Family remains at bedside.

## 2013-12-06 NOTE — Progress Notes (Signed)
Family and chaplain have gathered at bedside due to a change in patients vital signs.  BP has become low and labile requiring levophed to reestablish the blood pressure.  Family is aware of the severity of the situation.  Dr. Tyson AliasFeinstein was updated and states that goal for MAP is now 65.  Will cont to monitor closely.

## 2013-12-06 NOTE — Progress Notes (Signed)
Chaplain responded to page of pt that was actively dying.  Chaplain shared in prayer with the family and provided emotional support during their grief.  Chaplain was there for some time when a family minister arrived.  Chaplain thought it appropriate to end visit at that time.  Family seemed grateful for the chaplain presence.

## 2013-12-06 NOTE — Progress Notes (Signed)
INITIAL NUTRITION ASSESSMENT  DOCUMENTATION CODES Per approved criteria  -Morbid Obesity   INTERVENTION: Once pt is re-warmed, if enteral nutrition desired, recommend initiation of Vital High Protein at 25 ml/hr, increase by 10 ml q 4 hours, to goal of 55 ml/hr. Add 60 ml Prostat liquid protein daily. Goal regimen will provide: 1520 kcal (67% estimated kcal needs), 146 grams protein (99% estimated protein needs), 1104 ml free water. RD to continue to follow nutrition care plan.  NUTRITION DIAGNOSIS: Inadequate oral intake related to inability to eat as evidenced by NPO status.   Goal: Initiation of nutrition support within 24-48 hours and after re-warming process completed. Enteral nutrition to provide 60-70% of estimated calorie needs (22-25 kcals/kg ideal body weight) and 100% of estimated protein needs, based on ASPEN guidelines for permissive underfeeding in critically ill obese individuals.  Monitor:  weight trends, lab trends, I/O's, vent status/settings, temperatures, initiation of nutrition support  Reason for Assessment: Ventilator Use + Low Braden  58 y.o. female  Admitting Dx: cardiac arrest  ASSESSMENT: PMHx significant for DM and HTN. Admitted s/p cardiac arrest, was down for approximately 20-25 minutes.  Patient started on hypothermia protocol on 7/9. Pt went to cardiac cath urgently on admission - findings reveals single vessel occlusive CAD and mild LV dysfunction.   Patient is currently intubated on ventilator support, ETT placed 7/9. MV: 15.7 L/min Temp (24hrs), Avg:92.4 F (33.6 C), Min:90.7 F (32.6 C), Max:96.3 F (35.7 C)   Brief physical exam reveals pt is without significant fat and muscle wasting.   Sodium elevated at 150 Potassium low at 2.8 --> ordered for IV KCl Phosphorus low at 0.8 --> ordered for IV potassium phosphate Magnesium WNL --> ordered for IV magnesium sulfate CBG's are 174 - 198  Height: Ht Readings from Last 1 Encounters:   12/23/2013 5\' 6"  (1.676 m)    Weight: Wt Readings from Last 1 Encounters:  12/22/2013 285 lb (129.275 kg)    Ideal Body Weight: 130 lb/59.1 kg  % Ideal Body Weight: 219%  Wt Readings from Last 10 Encounters:  12/03/2013 285 lb (129.275 kg)  11/29/2013 285 lb (129.275 kg)    Usual Body Weight: n/a  % Usual Body Weight: n/a  BMI:  Body mass index is 46.02 kg/(m^2). Obese Class III  Estimated Nutritional Needs (estimated using 37C): Kcal: 2277 Permissive underfeeding kcal goal: 1366 - 1593 Protein: >148 grams Fluid: at least 2.2 liters daily  Skin: intact  Diet Order: NPO  EDUCATION NEEDS: -Education not appropriate at this time   Intake/Output Summary (Last 24 hours) at 12/06/13 0942 Last data filed at 12/06/13 0800  Gross per 24 hour  Intake 3644.62 ml  Output   4185 ml  Net -540.38 ml    Last BM: 7/9  Labs:   Recent Labs Lab 12/06/2013 1255  12/25/2013 2025 12/06/13 12/06/13 0430 12/06/13 0520  NA 145  < > 149* 149* 150*  --   K 3.0*  < > 3.3* 2.9* 2.8*  --   CL 104  < > 110 110 113*  --   CO2 14*  < > 12* 11* 15*  --   BUN 13  < > 13 12 12   --   CREATININE 1.07  < > 1.09 0.99 0.95  --   CALCIUM 6.9*  < > 6.9* 7.2* 7.3*  --   MG 2.2  --   --   --   --  1.7  PHOS 2.6  --   --   --   --  0.8*  GLUCOSE 455*  < > 346* 270* 202*  --   < > = values in this interval not displayed.  CBG (last 3)   Recent Labs  12/06/13 0329 12/06/13 0500 12/06/13 0842  GLUCAP 198* 189* 174*    Scheduled Meds: . ampicillin-sulbactam (UNASYN) IV  3 g Intravenous Q6H  . antiseptic oral rinse  15 mL Mouth Rinse QID  . artificial tears  1 application Both Eyes 3 times per day  . aspirin  81 mg Per NG tube Daily  . chlorhexidine  15 mL Mouth Rinse BID  . clopidogrel  75 mg Per NG tube Daily  . fentaNYL  100 mcg Intravenous Once  . heparin subcutaneous  5,000 Units Subcutaneous 3 times per day  . midazolam  2 mg Intravenous Once  . potassium chloride  40 mEq Oral Daily   . potassium phosphate IVPB (mmol)  30 mmol Intravenous Once  . vancomycin  1,000 mg Intravenous Q12H    Continuous Infusions: . sodium chloride 10 mL/hr at 11/29/2013 1200  . sodium chloride 10 mL/hr at 12/10/2013 1200  . sodium chloride 10 mL/hr at 12/21/2013 1639  . sodium chloride 10 mL/hr at 12/06/2013 1639  . cisatracurium (NIMBEX) infusion 1 mcg/kg/min (12/06/13 0800)  . fentaNYL infusion INTRAVENOUS 100 mcg/hr (12/06/13 0800)  . insulin (NOVOLIN-R) infusion 19.1 Units/hr (12/06/13 0000)  . midazolam (VERSED) infusion 2 mg/hr (12/06/13 0800)  . norepinephrine (LEVOPHED) Adult infusion 3 mcg/min (12/06/13 0800)    Past Medical History  Diagnosis Date  . HTN (hypertension)   . DM (diabetes mellitus)     History reviewed. No pertinent past surgical history.  Jarold MottoSamantha Saia Derossett MS, RD, LDN Inpatient Registered Dietitian Pager: [phone] After-hours pager: [phone]

## 2013-12-06 NOTE — Progress Notes (Signed)
eLink Physician-Brief Progress Note Patient Name: Christine Keith B Kustra DOB: 22-Oct-1955 MRN: [id number]  Date of Service  12/06/2013   HPI/Events of Note   Recent Labs Lab 11/30/2013 1255 12/04/2013 1600 11/28/2013 1757 12/03/2013 2025 12/06/13 12/06/13 0430 12/06/13 0520  NA 145 148* 148* 149* 149* 150*  --   K 3.0* 2.8* 3.0* 3.3* 2.9* 2.8*  --   CL 104 104 106 110 110 113*  --   CO2 14* 12* 11* 12* 11* 15*  --   GLUCOSE 455* 509* 443* 346* 270* 202*  --   BUN 13 14 13 13 12 12   --   CREATININE 1.07 1.07 1.07 1.09 0.99 0.95  --   CALCIUM 6.9* 7.3* 6.9* 6.9* 7.2* 7.3*  --   MG 2.2  --   --   --   --   --  1.7  PHOS 2.6  --   --   --   --   --  0.8*     eICU Interventions  Low mag Low phos  Plan Replete    Intervention Category Major Interventions: Electrolyte abnormality - evaluation and management  Juliza Machnik 12/06/2013, 6:35 AM

## 2013-12-06 NOTE — Progress Notes (Signed)
CRITICAL VALUE ALERT  Critical value received: Phosphorus 0.8  Date of notification:  12/06/13  Time of notification:  0550  Critical value read back:yes  Nurse who received alert:  Rivka SpringKaylee Swanson RN  Pola CornELink MD notifyed at [phone] on 12/06/13

## 2013-12-06 NOTE — Progress Notes (Signed)
PULMONARY / CRITICAL CARE MEDICINE   Name: Christine Keith MRN: [id number] DOB: [birth year]    ADMISSION DATE:  12/21/2013  REFERRING MD :  [facility]  PRIMARY SERVICE: PCCM  CHIEF COMPLAINT:  Post arrest   BRIEF PATIENT DESCRIPTION: 58 yo female with hx DM, HTN presented 7/9 from [place] post cardiac arrest with 20-25 min downtime.  Initial troponin 9 with ST depression.  Tx to [facility] for hypothermia protocol, cardiac cath.    SIGNIFICANT EVENTS / STUDIES:  7/9 Tx from [place] post cardiac arrest, bystander CPR, hypothermia protocol  7/9 cardiac cath>>>OM occlusion, all other cor open, 45%  LINES / TUBES: ETT 7/9>>> R IJ CVL 7/9>>> R fem aline 7/9>>>larger sheeth for cath>>>  CULTURES: BC 7/9>>>  ANTIBIOTICS: Unasyn 7/9>>> vanc 7/9>>>  Subjective: off pressors  VITAL SIGNS: Temp:  [90.7 F (32.6 C)-96.3 F (35.7 C)] 91.9 F (33.3 C) (07/10 0700) Pulse Rate:  [90-124] 102 (07/10 0600) Resp:  [22-35] 35 (07/10 0600) BP: (53-127)/(22-84) 127/84 mmHg (07/10 0338) SpO2:  [87 %-100 %] 97 % (07/10 0600) Arterial Line BP: (64-161)/(50-112) 111/77 mmHg (07/10 0600) FiO2 (%):  [40 %-50 %] 40 % (07/10 0338) Weight:  [129.275 kg (285 lb)] 129.275 kg (285 lb) (07/09 1100) HEMODYNAMICS: CVP:  [7 mmHg-16 mmHg] 7 mmHg VENTILATOR SETTINGS: Vent Mode:  [-] PRVC FiO2 (%):  [40 %-50 %] 40 % Set Rate:  [35 bmp] 35 bmp Vt Set:  [450 mL] 450 mL PEEP:  [5 cmH20] 5 cmH20 Plateau Pressure:  [17 cmH20-30 cmH20] 17 cmH20 INTAKE / OUTPUT: Intake/Output     07/09 0701 - 07/10 0700 07/10 0701 - 07/11 0700   I.V. (mL/kg) 2137 (16.5) 57.6 (0.4)   IV Piggyback 1350 100   Total Intake(mL/kg) 3487 (27) 157.6 (1.2)   Urine (mL/kg/hr) 3910 125 (0.6)   Emesis/NG output 150    Total Output 4060 125   Net -573 +32.6          PHYSICAL EXAMINATION: General:  Obese female, critically ill  Neuro:  Sedated on vent, RASS -6, paralyzed HEENT:  Ett, jvd wnl Cardiovascular:  s1s2  rrr distant sounds Lungs: rhonchi , reduced Abdomen:  Obese, soft, hypoactive bs  Musculoskeletal:  gen edema mild  LABS:  CBC  Recent Labs Lab 12/26/2013 1255 12/06/13 0341  WBC 25.8* 24.1*  HGB 16.4* 17.0*  HCT 48.8* 49.6*  PLT 225 181   Coag's  Recent Labs Lab 12/14/2013 1255 12/20/2013 2025  APTT 134* 81*  INR 1.45 1.32   BMET  Recent Labs Lab 12/09/2013 2025 12/06/13 12/06/13 0430  NA 149* 149* 150*  K 3.3* 2.9* 2.8*  CL 110 110 113*  CO2 12* 11* 15*  BUN 13 12 12   CREATININE 1.09 0.99 0.95  GLUCOSE 346* 270* 202*   Electrolytes  Recent Labs Lab 12/25/2013 1255  12/12/2013 2025 12/06/13 12/06/13 0430 12/06/13 0520  CALCIUM 6.9*  < > 6.9* 7.2* 7.3*  --   MG 2.2  --   --   --   --  1.7  PHOS 2.6  --   --   --   --  0.8*  < > = values in this interval not displayed. Sepsis Markers  Recent Labs Lab 12/20/2013 1255 12/25/2013 1757  LATICACIDVEN 9.7* 12.2*   ABG  Recent Labs Lab 12/13/2013 1230 12/06/13 0405  PHART 7.282* 7.336*  PCO2ART 33.7* 23.8*  PO2ART 267.0* 74.7*   Liver Enzymes  Recent Labs Lab 12/19/2013 1255  AST 541*  ALT 240*  ALKPHOS 148*  BILITOT 0.4  ALBUMIN 3.3*   Cardiac Enzymes  Recent Labs Lab 12/04/2013 1255 12/18/2013 1758 12/06/13 12/06/13 0430  TROPONINI >20.00* >20.00* >20.00* >20.00*  PROBNP 990.1*  --   --   --    Glucose  Recent Labs Lab 12/06/13 0005 12/06/13 0107 12/06/13 0119 12/06/13 0227 12/06/13 0329 12/06/13 0500  GLUCAP 251* 251* 238* 223* 198* 189*    Imaging Dg Chest Portable 1 View  12/06/2013   CLINICAL DATA:  Respiratory failure.  EXAM: PORTABLE CHEST - 1 VIEW  COMPARISON:  One-view chest 12/04/2013  FINDINGS: The heart size is normal. Lumbar Andes have decreased. The endotracheal tube terminates 2.5 cm above the carina and could be pulled back 1-2 cm for more optimal positioning. A right IJ line terminates in the mid SVC, above the cavoatrial junction. The NG tube courses off the inferior border  of the film. Mild bibasilar atelectasis is present. The lungs are otherwise clear. The visualized soft tissues and bony thorax are unremarkable.  IMPRESSION: 1. Decreased lung volumes. 2. Mild bibasilar atelectasis without other significant airspace disease. 3. The endotracheal tube terminates 2.5 cm from the carina and could be pulled back 1-2 cm for more optimal positioning.   Electronically Signed   By: Lawrence Santiago M.D.   On: 12/06/2013 07:23   Dg Chest Port 1 View  12/04/2013   CLINICAL DATA:  Endotracheal tube placement  EXAM: PORTABLE CHEST - 1 VIEW  COMPARISON:  12/14/2013, earlier the same day  FINDINGS: Endotracheal tube tip is approximately 2.8 cm above the base of the carina. Right IJ central line tip overlies the proximal SVC level. No evidence for right-sided pneumothorax. Lungs are clear bilaterally. The cardiopericardial silhouette is within normal limits for size. Telemetry leads overlie the chest. That fibrillated pads are seen over the left hemi thorax.  IMPRESSION: Right IJ central line tip overlies the proximal SVC level. No evidence for pneumothorax.   Electronically Signed   By: Misty Stanley M.D.   On: 12/18/2013 12:37      ASSESSMENT / PLAN:  PULMONARY Acute respiratory failure compensated metabolic acidosis At risk edema P:   abg reviewed, paralyzed, still requires high MV, rate 35 Repeat abg in 4 hours, likely can reduce rate further  pcxr in am   CARDIOVASCULAR PEA arrest  STEMI/ACS s/p cath, no intervention required Cardiogenic shock presumed R/o septic component P:  Anti plat therapy  Hypothermia protocol on going Titrate pressors for MAP >80 per hypothermia protocol, levo just shut off Anticoagulation per cards  F/u enzymes  Lactate repeat this Cortisol 40 , no role steroids Echo assessment needed still With limited results on cath to explain, will assess drug tox, tylenol level, salicylate  RENAL Metabolic acidosis - lactic, cardiogenic shock  presumed R/o rta contribution from dM, type 4 AKI  Risk brain edema Hypophos, hypomag P:   Aggressive replacement pjos, mag, repeat values Allow NA to rise in setting anoxia k supp No free water NO 1/2 NS as of now Repeat lactic needed There is NO NON AG component, in fact there is a small met alk, no bicarb needed Would tolerate na 155  GASTROINTESTINAL No active issue  P:   NPO Consider early TF after rewarm  LFT in am  Assess amy, lip, ldh  HEMATOLOGIC ACS P:  SQ heparin restart if hep remains off  Will defer further anticoagulation to cards  scd  INFECTIOUS R/o occult infection as cause, at risk for  infection hypothermia, leukocytosis P:   Empiric unasyn, vanc BC to follow  ENDOCRINE Hx DM, no evidence rel AI P:   SSI  NEUROLOGIC AMS - post arrest, high risk anoxia P:   Sedation/ nmb per hypothermia protocol Assess mental status after rewarming and keep normothermia x 72 hrs Re-warm at 3 pm planned  I have personally obtained a history, examined the patient, evaluated laboratory and imaging results, formulated the assessment and plan and placed orders.  Family updated in room  CRITICAL CARE: The patient is critically ill with multiple organ systems failure and requires high complexity decision making for assessment and support, frequent evaluation and titration of therapies, application of advanced monitoring technologies and extensive interpretation of multiple databases. Critical Care Time devoted to patient care services described in this note is 35  minutes.   *Care during the described time interval was provided by me and/or other providers on the critical care team. I have reviewed this patient's available data, including medical history, events of note, physical examination and test results as part of my evaluation.  Lavon Paganini. Titus Mould, MD, [place] Pgr: [facility]

## 2013-12-06 NOTE — Progress Notes (Signed)
CRITICAL VALUE ALERT  Critical value received:  Potassium 2.9  Date of notification:  7/10  Time of notification:  0240  Critical value read back: yes   Nurse who received alert:  Rivka SpringKaylee Swanson RN   Pola CornElink MD notifyed at [phone] on 12/06/13.

## 2013-12-06 NOTE — Progress Notes (Signed)
CRITICAL VALUE ALERT  Critical value received:  Potassium 2.8  Date of notification:  7/10  Time of notification:  0523  Critical value read back: yes  Nurse who received alert:  Rivka SpringKaylee Swanson RN    Pola CornELink MD notifyed at [phone] on 7/10. Pt currently receiving 4 runs of potassium.

## 2013-12-06 NOTE — Progress Notes (Signed)
ANTICOAGULATION CONSULT NOTE - Follow Up Consult  Pharmacy Consult for Heparin  Indication: chest pain/ACS  Allergies  Allergen Reactions  . Codeine Other (See Comments)    unknown  . Red Dye Other (See Comments)    migraine    Patient Measurements: Height: 5\' 6"  (167.6 cm) Weight: 285 lb (129.275 kg) IBW/kg (Calculated) : 59.3 Heparin Dosing Weight: 110 kg  Vital Signs: Temp: 91.8 F (33.2 C) (07/10 0000) Temp src: Core (Comment) (07/10 0000) BP: 114/80 mmHg (07/09 2013) Pulse Rate: 100 (07/10 0000)  Labs:  Recent Labs  12/14/2013 1255 12/06/2013 1600 12/12/2013 1757 12/15/2013 1758 12/12/2013 2025 12/21/2013 2300  HGB 16.4*  --   --   --   --   --   HCT 48.8*  --   --   --   --   --   PLT 225  --   --   --   --   --   APTT 134*  --   --   --  81*  --   LABPROT 17.6*  --   --   --  16.4*  --   INR 1.45  --   --   --  1.32  --   HEPARINUNFRC  --   --   --   --   --  0.57  CREATININE 1.07 1.07 1.07  --  1.09  --   TROPONINI >20.00*  --   --  >20.00*  --   --     Estimated Creatinine Clearance: 79.4 ml/min (by C-G formula based on Cr of 1.09).   Medications:  Heparin 1100 units/hr  Assessment: 58 y/o F on arctic sun protocol s/p arrest. HL is 0.57. Others labs as above.   Goal of Therapy:  Heparin level 0.3-0.7 units/ml Monitor platelets by anticoagulation protocol: Yes   Plan:  -Continue heparin infusion at 1100 units/hr  -AM HL -Daily CBC/HL -Monitor for bleeding -F/U timing of warming   Abran DukeLedford, Dev Dhondt 12/06/2013,12:18 AM

## 2013-12-06 NOTE — Progress Notes (Signed)
Per ICU hyperglycemia protocol, Insulin gtt was stopped x 30 minutes and multiplier was reset due to high dosage of insulin at greater than 30 units per hour per the glucose stabilizer.

## 2013-12-06 NOTE — Progress Notes (Signed)
eLink Physician-Brief Progress Note Patient Name: Christine Keith DOB: 1956/03/04 MRN: [id number]  Date of Service  12/06/2013   HPI/Events of Note    Recent Labs Lab 12/02/2013 1255 12/26/2013 1600 12/23/2013 1757 12/02/2013 2025 12/06/13  K 3.0* 2.8* 3.0* 3.3* 2.9*    Recent Labs Lab 12/18/2013 1255 12/22/2013 1600 12/14/2013 1757 12/04/2013 2025 12/06/13  CREATININE 1.07 1.07 1.07 1.09 0.99      eICU Interventions  Low k  Plan Iv kcl 10meq x 4 runs   Intervention Category Major Interventions: Electrolyte abnormality - evaluation and management  Itati Brocksmith 12/06/2013, 3:39 AM

## 2013-12-06 NOTE — Progress Notes (Signed)
Called Rhonda Barrett, PA to discuss arterial femoral sheath and the order to remove the sheath.  RT was unable to obtain radial a line access. Due to the critical nature of this patient and per Theodore Demarkhonda Barrett, PA we will leave the sheath in for now and will reassess the need for the sheath in the am.

## 2013-12-06 NOTE — Procedures (Signed)
Arterial Catheter Insertion Procedure Note Neldon McDonna B Adel [id number] September 03, 1955  Procedure: Insertion of Arterial Catheter  Indications: Blood pressure monitoring and Frequent blood sampling  Procedure Details Consent: Risks of procedure as well as the alternatives and risks of each were explained to the (patient/caregiver).  Consent for procedure obtained. Time Out: Verified patient identification, verified procedure, site/side was marked, verified correct patient position, special equipment/implants available, medications/allergies/relevent history reviewed, required imaging and test results available.  Performed  Maximum sterile technique was used including antiseptics, cap, gloves, gown, hand hygiene, mask and sheet. Skin prep: Chlorhexidine; local anesthetic administered 20 gauge catheter was inserted into left radial artery using the Seldinger technique.  Evaluation Blood flow good; BP tracing good. Complications: No apparent complications.   Armando GangMike, Jamone Garrido C 12/06/2013

## 2013-12-06 NOTE — Progress Notes (Signed)
    Subjective:  Intubated and sedated   Objective:  Filed Vitals:   12/06/13 0400 12/06/13 0500 12/06/13 0600 12/06/13 0700  BP:      Pulse: 97 101 102   Temp: 90.7 F (32.6 C) 90.9 F (32.7 C) 91 F (32.8 C) 91.9 F (33.3 C)  TempSrc: Core (Comment) Core (Comment) Core (Comment) Core (Comment)  Resp: 35 35 35   Height:      Weight:      SpO2: 97% 96% 97%     Intake/Output from previous day:  Intake/Output Summary (Last 24 hours) at 12/06/13 0821 Last data filed at 12/06/13 0800  Gross per 24 hour  Intake 3644.62 ml  Output   4185 ml  Net -540.38 ml    Physical Exam: Physical exam: Well-developed; intubated Skin is warm and dry.  HEENT is normal.  Neck is supple.  Chest is clear to auscultation with normal expansion.  Cardiovascular exam is regular rate and rhythm.  Abdominal exam nontender or distended. No masses palpated. Extremities show no edema. Right groin sheath in place neuro not assessed    Lab Results: Basic Metabolic Panel:  Recent Labs  16/10/96Feb 25, 2015 1255  12/06/13 12/06/13 0430 12/06/13 0520  NA 145  < > 149* 150*  --   K 3.0*  < > 2.9* 2.8*  --   CL 104  < > 110 113*  --   CO2 14*  < > 11* 15*  --   GLUCOSE 455*  < > 270* 202*  --   BUN 13  < > 12 12  --   CREATININE 1.07  < > 0.99 0.95  --   CALCIUM 6.9*  < > 7.2* 7.3*  --   MG 2.2  --   --   --  1.7  PHOS 2.6  --   --   --  0.8*  < > = values in this interval not displayed. CBC:  Recent Labs  Jul 24, 2013 1255 12/06/13 0341  WBC 25.8* 24.1*  HGB 16.4* 17.0*  HCT 48.8* 49.6*  MCV 95.7 93.6  PLT 225 181   Cardiac Enzymes:  Recent Labs  Jul 24, 2013 1758 12/06/13 12/06/13 0430  TROPONINI >20.00* >20.00* >20.00*     Assessment/Plan:  1 status post myocardial infarction-cath results noted. She has total occlusion of first obtuse marginal. No PCI performed. Remaining vessels are normal. Ejection fraction 45%. Continue aspirin. Add Plavix 75 mg daily. Add statin later. Levophed has  been weaned to off. Add beta blocker tomorrow if blood pressure allows. Discontinue heparin. Remove femoral sheath. 2 status post cardiac arrest/prolonged CPR-patient is presently being cooled. Warming to begin this p.m. Concerned about anoxic encephalopathy. Follow. 3 VDRF-being followed by critical care medicine. She is on prophylactic antibiotics for possible aspiration. 4 hypernatremia/hypokalemia/hypophosphatemia-increase free water and supplement electrolytes. Prognosis guarded. Nephew updated.  Olga MillersBrian Lismary Kiehn 12/06/2013, 8:21 AM

## 2013-12-06 NOTE — Clinical Documentation Improvement (Signed)
Pt with OOH Cardiac Arrest  Pt with concern for anoxic injury  per CT head   Please clarify if pt with anoxic injury or other diagnosis and document in pn or d/c summary  Possible Clinical Conditions?   Anoxic brain injury   Anoxic Encephalopathy  ____________________  _______Other Condition__________________  _______Cannot Clinically Determine   Supporting Information: Risk Factors:  Signs & Symptoms: Warming to begin this p.m. Concerned about anoxic encephalopathy.PER PN 12/06/13 Risk brain edema per pn 12/06/13  Diagnostics: CT HEAD W/O CONTRAST 12/06/13 FINDINGS: There are patchy bilateral areas of low density scattered throughout the cerebral hemispheres bilaterally concerning for numerous acute infarcts, possibly anoxic injury. The rather widespread and diffuse nature of this raises the possibility of diffuse anoxic injury.  Treatment Hypothermia protocol Thank You, Christine Keith ,RN Clinical Documentation Specialist:  [phone]  [facility]- Health Information Management

## 2013-12-07 ENCOUNTER — Inpatient Hospital Stay (HOSPITAL_COMMUNITY): Payer: Medicare Other

## 2013-12-07 DIAGNOSIS — G9340 Encephalopathy, unspecified: Secondary | ICD-10-CM

## 2013-12-07 DIAGNOSIS — J9601 Acute respiratory failure with hypoxia: Secondary | ICD-10-CM | POA: Diagnosis present

## 2013-12-07 DIAGNOSIS — J9600 Acute respiratory failure, unspecified whether with hypoxia or hypercapnia: Secondary | ICD-10-CM

## 2013-12-07 DIAGNOSIS — A419 Sepsis, unspecified organism: Secondary | ICD-10-CM

## 2013-12-07 DIAGNOSIS — N179 Acute kidney failure, unspecified: Secondary | ICD-10-CM

## 2013-12-07 DIAGNOSIS — R6521 Severe sepsis with septic shock: Secondary | ICD-10-CM

## 2013-12-07 LAB — BASIC METABOLIC PANEL
ANION GAP: 22 — AB (ref 5–15)
BUN: 16 mg/dL (ref 6–23)
CO2: 15 mEq/L — ABNORMAL LOW (ref 19–32)
Calcium: 7.2 mg/dL — ABNORMAL LOW (ref 8.4–10.5)
Chloride: 110 mEq/L (ref 96–112)
Creatinine, Ser: 1.29 mg/dL — ABNORMAL HIGH (ref 0.50–1.10)
GFR calc Af Amer: 53 mL/min — ABNORMAL LOW (ref >90)
GFR, EST NON AFRICAN AMERICAN: 45 mL/min — AB (ref >90)
Glucose, Bld: 189 mg/dL — ABNORMAL HIGH (ref 70–99)
Potassium: 3.8 mEq/L (ref 3.7–5.3)
SODIUM: 147 meq/L (ref 137–147)

## 2013-12-07 LAB — CBC WITH DIFFERENTIAL/PLATELET
BASOS ABS: 0 10*3/uL (ref 0.0–0.1)
Basophils Relative: 0 % (ref 0–1)
EOS ABS: 0 10*3/uL (ref 0.0–0.7)
Eosinophils Relative: 0 % (ref 0–5)
HCT: 45 % (ref 36.0–46.0)
HEMOGLOBIN: 15.6 g/dL — AB (ref 12.0–15.0)
Lymphocytes Relative: 13 % (ref 12–46)
Lymphs Abs: 3.5 10*3/uL (ref 0.7–4.0)
MCH: 32 pg (ref 26.0–34.0)
MCHC: 34.7 g/dL (ref 30.0–36.0)
MCV: 92.2 fL (ref 78.0–100.0)
MONO ABS: 1.9 10*3/uL — AB (ref 0.1–1.0)
Monocytes Relative: 7 % (ref 3–12)
NEUTROS ABS: 21.7 10*3/uL — AB (ref 1.7–7.7)
Neutrophils Relative %: 80 % — ABNORMAL HIGH (ref 43–77)
PLATELETS: 193 10*3/uL (ref 150–400)
RBC: 4.88 MIL/uL (ref 3.87–5.11)
RDW: 13.5 % (ref 11.5–15.5)
WBC: 27.1 10*3/uL — AB (ref 4.0–10.5)

## 2013-12-07 LAB — COMPREHENSIVE METABOLIC PANEL
ALT: 224 U/L — ABNORMAL HIGH (ref 0–35)
ANION GAP: 19 — AB (ref 5–15)
AST: 246 U/L — ABNORMAL HIGH (ref 0–37)
Albumin: 2.7 g/dL — ABNORMAL LOW (ref 3.5–5.2)
Alkaline Phosphatase: 101 U/L (ref 39–117)
BUN: 16 mg/dL (ref 6–23)
CALCIUM: 7.3 mg/dL — AB (ref 8.4–10.5)
CO2: 18 mEq/L — ABNORMAL LOW (ref 19–32)
CREATININE: 1.34 mg/dL — AB (ref 0.50–1.10)
Chloride: 111 mEq/L (ref 96–112)
GFR calc non Af Amer: 43 mL/min — ABNORMAL LOW (ref >90)
GFR, EST AFRICAN AMERICAN: 50 mL/min — AB (ref >90)
GLUCOSE: 158 mg/dL — AB (ref 70–99)
Potassium: 3.7 mEq/L (ref 3.7–5.3)
Sodium: 148 mEq/L — ABNORMAL HIGH (ref 137–147)
TOTAL PROTEIN: 5.6 g/dL — AB (ref 6.0–8.3)
Total Bilirubin: 0.4 mg/dL (ref 0.3–1.2)

## 2013-12-07 LAB — GLUCOSE, CAPILLARY
GLUCOSE-CAPILLARY: 137 mg/dL — AB (ref 70–99)
GLUCOSE-CAPILLARY: 166 mg/dL — AB (ref 70–99)
GLUCOSE-CAPILLARY: 168 mg/dL — AB (ref 70–99)
GLUCOSE-CAPILLARY: 175 mg/dL — AB (ref 70–99)
GLUCOSE-CAPILLARY: 199 mg/dL — AB (ref 70–99)
GLUCOSE-CAPILLARY: 221 mg/dL — AB (ref 70–99)
Glucose-Capillary: 136 mg/dL — ABNORMAL HIGH (ref 70–99)
Glucose-Capillary: 140 mg/dL — ABNORMAL HIGH (ref 70–99)
Glucose-Capillary: 161 mg/dL — ABNORMAL HIGH (ref 70–99)
Glucose-Capillary: 175 mg/dL — ABNORMAL HIGH (ref 70–99)
Glucose-Capillary: 178 mg/dL — ABNORMAL HIGH (ref 70–99)
Glucose-Capillary: 178 mg/dL — ABNORMAL HIGH (ref 70–99)
Glucose-Capillary: 181 mg/dL — ABNORMAL HIGH (ref 70–99)
Glucose-Capillary: 182 mg/dL — ABNORMAL HIGH (ref 70–99)
Glucose-Capillary: 202 mg/dL — ABNORMAL HIGH (ref 70–99)
Glucose-Capillary: 204 mg/dL — ABNORMAL HIGH (ref 70–99)

## 2013-12-07 LAB — BLOOD GAS, ARTERIAL
Acid-base deficit: 6.4 mmol/L — ABNORMAL HIGH (ref 0.0–2.0)
Bicarbonate: 17.4 mEq/L — ABNORMAL LOW (ref 20.0–24.0)
Drawn by: 41308
FIO2: 0.4 %
MECHVT: 450 mL
O2 Saturation: 98.9 %
PATIENT TEMPERATURE: 96.8
PEEP/CPAP: 5 cmH2O
PO2 ART: 119 mmHg — AB (ref 80.0–100.0)
RATE: 35 resp/min
TCO2: 18.3 mmol/L (ref 0–100)
pCO2 arterial: 26.9 mmHg — ABNORMAL LOW (ref 35.0–45.0)
pH, Arterial: 7.421 (ref 7.350–7.450)

## 2013-12-07 LAB — TROPONIN I: Troponin I: >20 ng/mL (ref <0.30)

## 2013-12-07 LAB — MAGNESIUM: Magnesium: 1.9 mg/dL (ref 1.5–2.5)

## 2013-12-07 LAB — PHOSPHORUS: Phosphorus: 3.5 mg/dL (ref 2.3–4.6)

## 2013-12-09 NOTE — Discharge Summary (Signed)
PULMONARY / CRITICAL CARE MEDICINE DEATH NOTE   Name: Christine Keith MRN: [id number] DOB: Oct 21, 1955    ADMISSION DATE:  12/06/2013  [facility] :  [facility]  PRIMARY SERVICE: PCCM  Final diagnoses: #1 ST elevation MI -Severe anoxic brain injury   HISTORY OF PRESENT ILLNESS: 58 yo female with hx DM, HTN found down at home pulseless by husband. Husband performed CPR. Total downtime approx 25 mins [facility] Initial troponin 9 with ST depression. Per husband was c/o chest and arm pain for several days prior to arrest. Tx to [facility] for hypothermia protocol, cardiac cath.  PAST MEDICAL HISTORY :  Past Medical History   Diagnosis  Date   .  HTN (hypertension)    .  DM (diabetes mellitus)    No past surgical history on file.  Prior to Admission medications   Not on File   No Known Allergies  FAMILY HISTORY:  No family history on file.  SOCIAL HISTORY:  has no tobacco, alcohol, and drug history on file.  REVIEW OF SYSTEMS: Unable  VITAL SIGNS:  Weight: [285 lb (129.275 kg)] 285 lb (129.275 kg) (07/09 1100)  HEMODYNAMICS:  VENTILATOR SETTINGS:  INTAKE / OUTPUT:  Intake/Output  None  PHYSICAL EXAMINATION:  General: Obese female, critically ill  Neuro: Sedated on vent, RASS -2  HEENT: Mm dry, ETT  Cardiovascular: s1s2 tachy  Lungs: resps even non labored on vent, diminished throughout, few scattered rhonchi  Abdomen: Obese, soft, hypoactive bs  Musculoskeletal: Cool and dry, scant BLE edema   Hospital course: Patient was admitted on 12/27/2013 [facility] and she underwent a left heart catheterization which demonstrated a distal obtuse marginal one occlusion as well as mild LV dysfunction. She was admitted to the intensive care unit for hypothermia protocol. After rewarming she was found to have a GCS score of 3, fixed dilated pupils without any pupillary light response. A CT scan of the head demonstrated diffuse anoxic brain injury. She had severe  cardiogenic shock treated with vasopressors. We discussed the situation with the patient's family and on July 11 we which are support and the patient died peacefully with family at the bedside.

## 2013-12-11 LAB — CULTURE, BLOOD (ROUTINE X 2)
Culture: NO GROWTH
Culture: NO GROWTH

## 2013-12-28 NOTE — Progress Notes (Signed)
Fentanyl 50 cc and versed 35 cc was wasted in the sink and witnessed by 2 RN's, myself and Mikeal HawthorneSylvia Piland, RN

## 2013-12-28 NOTE — Progress Notes (Signed)
Pt still has right femoral sheath in place, but due to Pt's labile BP Deterding MD requested to hold sheath removal.

## 2013-12-28 NOTE — Progress Notes (Addendum)
At 1030 patient was extubated to room air.  Levophed and Vasopressin were discontinued.  Pt had no spontaneous respirations and expired at 1035.  2 RN's confirmed by auscultation that pt was without spontaneous respirations and without heart tones.  Family was at the bedside. [facility] was present on site and patient was ruled out for any donation. The funeral home has been notified by the family.

## 2013-12-28 NOTE — Progress Notes (Signed)
Chaplain paged to support family of dying pt. Pt's daughter, pt's boyfriend, and several cousins were present. Family requested I anoint pt with oil and then lead them in prayer before life support is removed. I retrieved a vessel of oil from CPE office, anointed the pt, and led family in prayer. Family expressed appreciation for chaplain's presence.

## 2013-12-28 NOTE — Progress Notes (Signed)
PULMONARY / CRITICAL CARE MEDICINE   Name: Christine Keith MRN: [id number] DOB: 1955-09-08    ADMISSION DATE:  12/11/2013  REFERRING MD :  [facility] hospital  PRIMARY SERVICE: PCCM  CHIEF COMPLAINT:  Post arrest   BRIEF PATIENT DESCRIPTION: 58 yo female with hx DM, HTN presented 7/9 from [facility] hospital post cardiac arrest with 20-25 min downtime.  Initial troponin 9 with ST depression.  Tx to [facility] for hypothermia protocol, cardiac cath.    SIGNIFICANT EVENTS / STUDIES:  7/9 Tx from [facility] post cardiac arrest, bystander CPR, hypothermia protocol  7/9 cardiac cath>>> distal OM1 occlusion, mild LV dysfunction 7/10 CT head > worrisome for diffuse anoxic brain injury  LINES / TUBES: ETT 7/9>>> R IJ CVL 7/9>>> R fem aline 7/9>>>  CULTURES: 7/9 blood >> 7/9 resp >>  ANTIBIOTICS: 7/9 unasyn >> 7/9 vanc >>   Subjective: no response to external stimuli;    VITAL SIGNS: Temp:  [90.9 F (32.7 C)-97 F (36.1 C)] 96.8 F (36 C) (07/11 0900) Pulse Rate:  [71-123] 121 (07/11 0900) Resp:  [21-35] 35 (07/11 0900) BP: (72-99)/(51-91) 99/91 mmHg (07/11 0411) SpO2:  [94 %-98 %] 98 % (07/11 0900) Arterial Line BP: (32-155)/(24-120) 117/80 mmHg (07/11 0900) FiO2 (%):  [40 %] 40 % (07/11 0832) HEMODYNAMICS: CVP:  [12 mmHg-16 mmHg] 16 mmHg VENTILATOR SETTINGS: Vent Mode:  [-] PRVC FiO2 (%):  [40 %] 40 % Set Rate:  [35 bmp] 35 bmp Vt Set:  [450 mL] 450 mL PEEP:  [5 cmH20] 5 cmH20 Plateau Pressure:  [16 cmH20-19 cmH20] 16 cmH20 INTAKE / OUTPUT: Intake/Output     07/10 0701 - 07/11 0700 07/11 0701 - 07/12 0700   I.V. (mL/kg) 1520.3 (11.8) 124.5 (1)   NG/GT 60    IV Piggyback 1452.5    Total Intake(mL/kg) 3032.8 (23.5) 124.5 (1)   Urine (mL/kg/hr) 685 (0.2) 165 (0.6)   Emesis/NG output  100 (0.4)   Total Output 685 265   Net +2347.8 -140.5          PHYSICAL EXAMINATION: General: GCS 3 HEENT: pupils dilated, no response to light PULM: CTA B CV: Tachy, regular,  gallop AB: soft, pads in place Ext: cool, no edema Neuro: GCS 3, no response to any stimuli  LABS:  CBC  Recent Labs Lab 12/09/2013 1255 12/06/13 0341 12-27-2013 0415  WBC 25.8* 24.1* 27.1*  HGB 16.4* 17.0* 15.6*  HCT 48.8* 49.6* 45.0  PLT 225 181 193   Coag's  Recent Labs Lab 12/16/2013 1255 12/26/2013 2025  APTT 134* 81*  INR 1.45 1.32   BMET  Recent Labs Lab 12/06/13 2030 2013-12-27 0208 12/27/2013 0415  NA 145 147 148*  K 3.7 3.8 3.7  CL 108 110 111  CO2 16* 15* 18*  BUN 15 16 16   CREATININE 1.12* 1.29* 1.34*  GLUCOSE 255* 189* 158*   Electrolytes  Recent Labs Lab 11/30/2013 1255  12/06/13 0520  12/06/13 2030 December 27, 2013 0208 12-27-2013 0415  CALCIUM 6.9*  < >  --   < > 7.3* 7.2* 7.3*  MG 2.2  --  1.7  --   --   --  1.9  PHOS 2.6  --  0.8*  --   --   --  3.5  < > = values in this interval not displayed. Sepsis Markers  Recent Labs Lab 12/16/2013 1255 12/25/2013 1757 12/06/13 0930  LATICACIDVEN 9.7* 12.2* 5.2*   ABG  Recent Labs Lab 12/06/13 0405 12/06/13 0938 12/27/13 0355  PHART 7.336*  7.336* 7.421  PCO2ART 23.8* 26.5* 26.9*  PO2ART 74.7* 84.0 119.0*   Liver Enzymes  Recent Labs Lab 12/06/2013 1255 12/14/2013 0415  AST 541* 246*  ALT 240* 224*  ALKPHOS 148* 101  BILITOT 0.4 0.4  ALBUMIN 3.3* 2.7*   Cardiac Enzymes  Recent Labs Lab 12/01/2013 1255  12/06/13 2030 12/10/2013 0030 12/24/2013 0540  TROPONINI >20.00*  < > >20.00* >20.00* >20.00*  PROBNP 990.1*  --   --   --   --   < > = values in this interval not displayed. Glucose  Recent Labs Lab 12/06/13 2347 12/15/2013 0051 12/02/2013 0155 12/06/2013 0257 11/27/2013 0413 11/30/2013 0520  GLUCAP 178* 166* 140* 136* 137* 161*    Imaging     ASSESSMENT / PLAN:  PULMONARY Acute respiratory failure  P:   Continue full vent support for now but suspect that she will not breath spontaneously with extubation  CARDIOVASCULAR PEA arrest from STEMI/ACS  Refractory shock > cardiogenic?  Septic? P:  Maintain pressor support for now Plan withdraw support today  RENAL AKI  P:   Continue IVF for now  GASTROINTESTINAL No active issue  P:   NPO  HEMATOLOGIC No acute issues, no bleeding P:  Monitor for bleeding  INFECTIOUS Aspiration pneumonitis? P:   Maintain vanc/unasyn for now F/u cultures  ENDOCRINE Hx DM    P:   SSI cortisol  NEUROLOGIC Anoxic brain injury > exam worrisome for brain death P:   Plan withdrawal of support today  I spent a significant amount of time talking to family today including her daughter.  She has refractory shock, findings of anoxia on exam and her physical exam is worrisome for brain death.  Plan to withdraw support today when family arrives  CDS to talk to family  Code: DNR   I have personally obtained a history, examined the patient, evaluated laboratory and imaging results, formulated the assessment and plan and placed orders. CRITICAL CARE: The patient is critically ill with multiple organ systems failure and requires high complexity decision making for assessment and support, frequent evaluation and titration of therapies, application of advanced monitoring technologies and extensive interpretation of multiple databases. Critical Care Time devoted to patient care services described in this note is 45 minutes.   Heber CarolinaBrent Lynnie Koehler, MD [place] P[facility]M Pager: [phone] Cell: [phone] If no response, call [phone]

## 2013-12-28 DEATH — deceased

## 2014-05-08 ENCOUNTER — Encounter (HOSPITAL_COMMUNITY): Payer: Self-pay | Admitting: Cardiology

## 2015-07-20 IMAGING — CR DG CHEST 1V PORT
1 series · 1 of 1 positions shown · non-contrast
Comparison: 12/05/2013, earlier the same day

CLINICAL DATA: Endotracheal tube placement

EXAM:
PORTABLE CHEST - 1 VIEW

[AP]
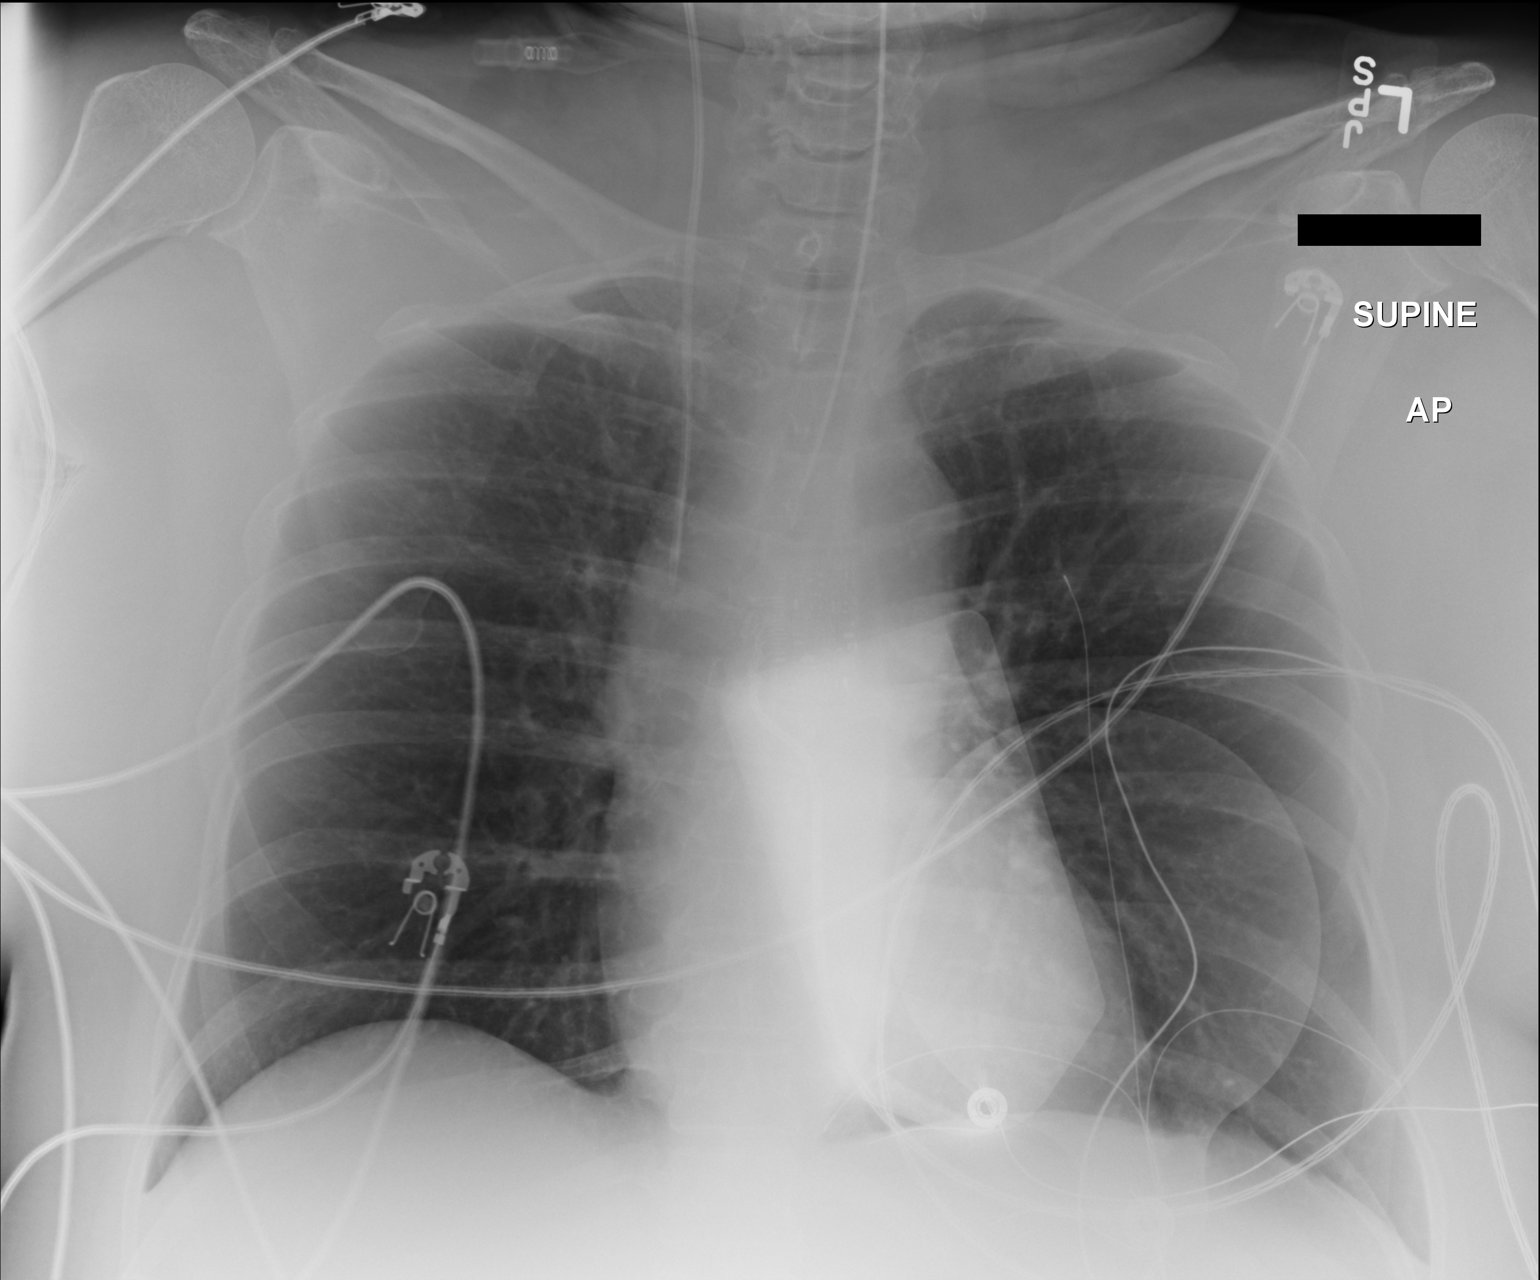

[1 of 1 positions shown; findings below may reference images not displayed]

FINDINGS: Endotracheal tube tip is approximately 2.8 cm above the base of the
carina. Right IJ central line tip overlies the proximal SVC level.
No evidence for right-sided pneumothorax. Lungs are clear
bilaterally. The cardiopericardial silhouette is within normal
limits for size. Telemetry leads overlie the chest. That fibrillated
pads are seen over the left hemi thorax.
IMPRESSION: Right IJ central line tip overlies the proximal SVC level. No
evidence for pneumothorax.

## 2015-07-21 IMAGING — CT CT HEAD W/O CM
1 of 2 series · 13 of 30 positions shown, 17 images · non-contrast
Comparison: None.

CLINICAL DATA: Post arrest. Dilated left pupil. Assess for
hemorrhage or stroke.

EXAM:
CT HEAD WITHOUT CONTRAST
TECHNIQUE: Contiguous axial images were obtained from the base of the skull
through the vertex without intravenous contrast.

[Series 1: — · axial · 0.49mm/px · z∈[-396,-306]mm · 13 of 22 slices shown, 17 images]
[im 2/22  brain]
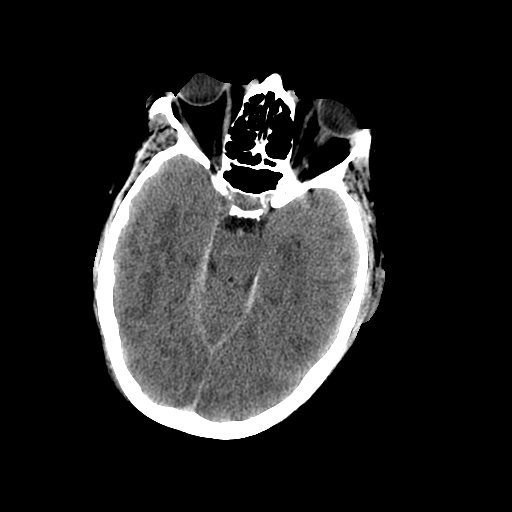
[im 2/22  bone]
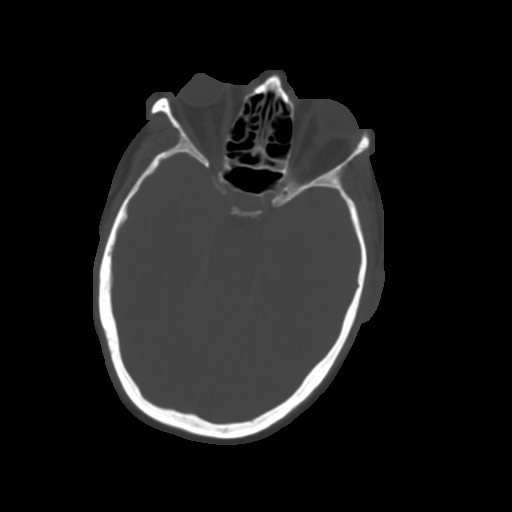
[im 4/22  brain]
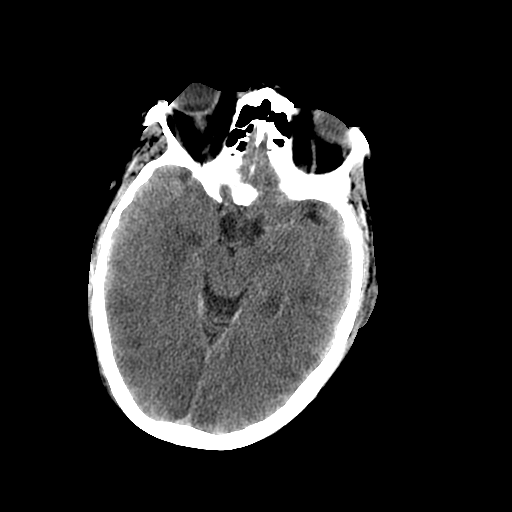
[im 5/22  brain]
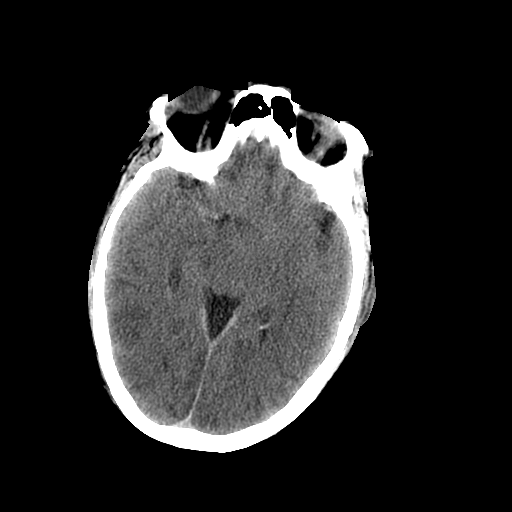
[im 7/22  brain]
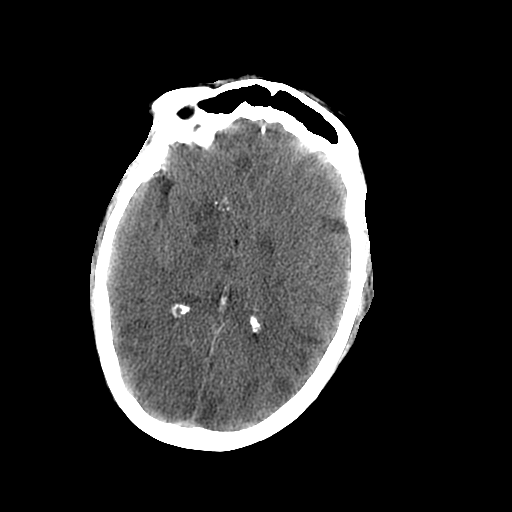
[im 8/22  brain]
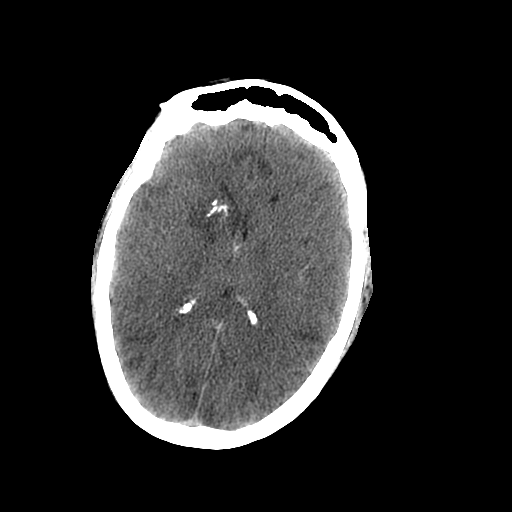
[im 8/22  bone]
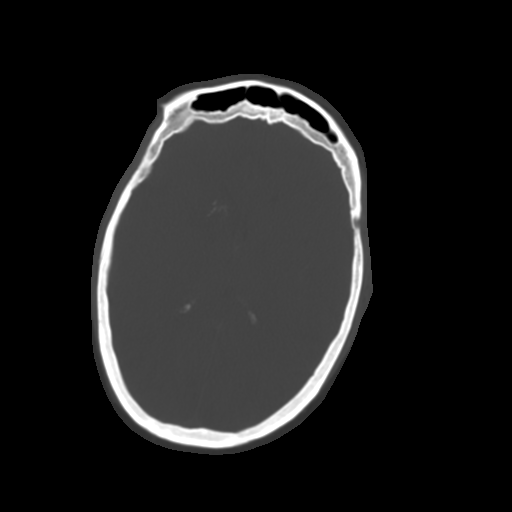
[im 10/22  brain]
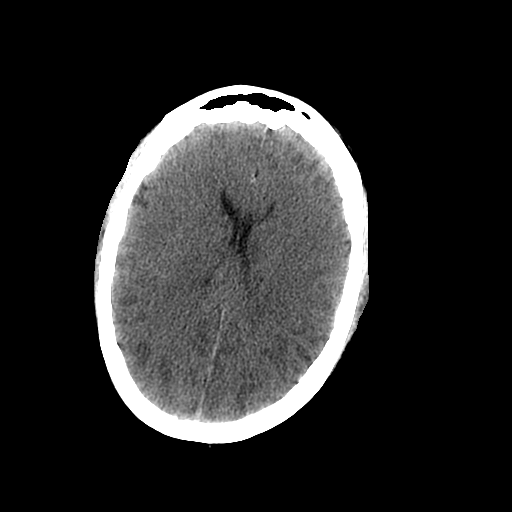
[im 11/22  brain]
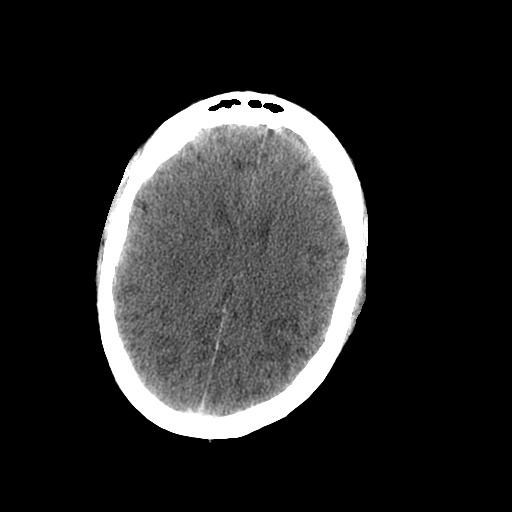
[im 13/22  brain]
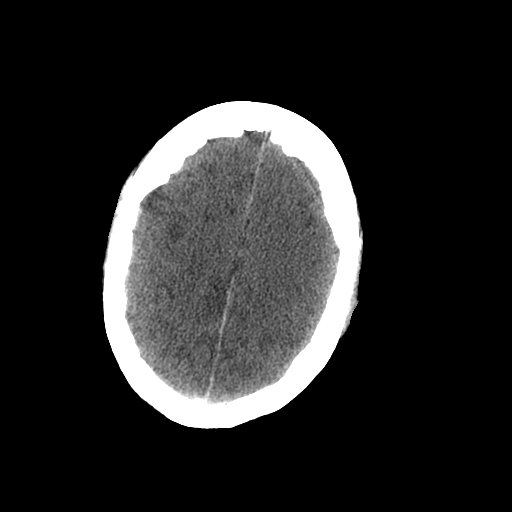
[im 14/22  brain]
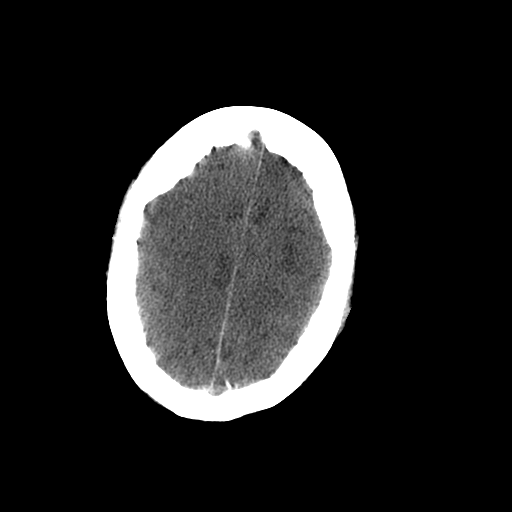
[im 14/22  bone]
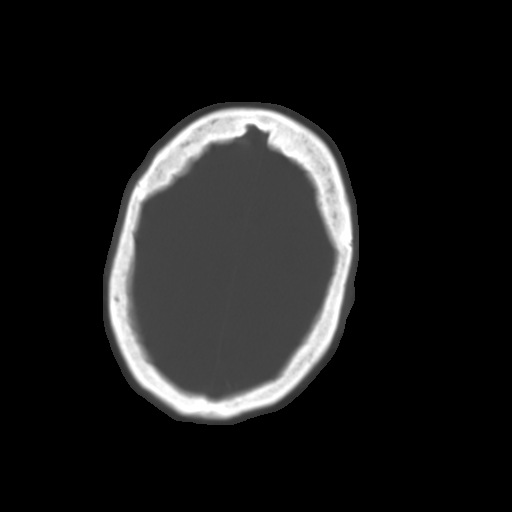
[im 16/22  brain]
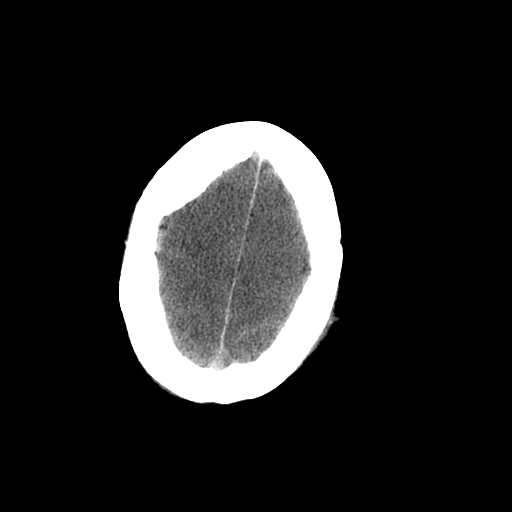
[im 17/22  brain]
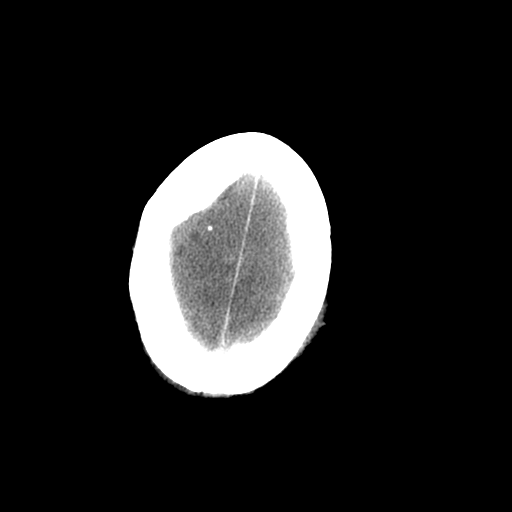
[im 19/22  brain]
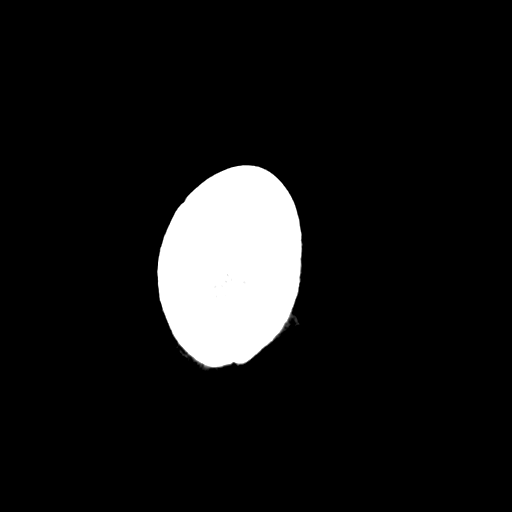
[im 20/22  brain]
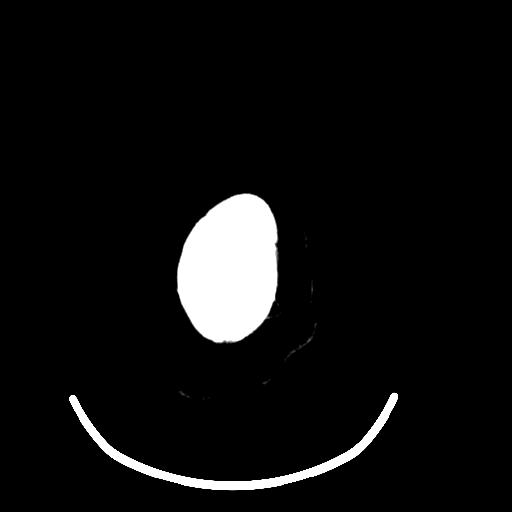
[im 20/22  bone]
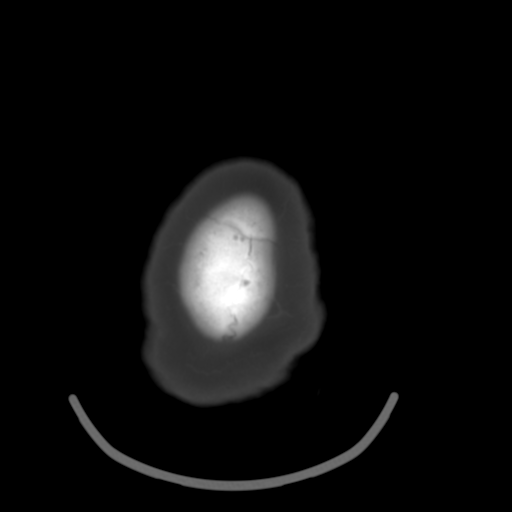

[13 of 30 positions shown; findings below may reference images not displayed]

FINDINGS: There are patchy bilateral areas of low density scattered throughout
the cerebral hemispheres bilaterally concerning for numerous acute
infarcts, possibly anoxic injury. Study somewhat limited with
portable scan. No hemorrhage. No hydrocephalus.

Calcifications noted in the right basal ganglia. Areas of low
density within both basal ganglia, right greater than left.

No acute calvarial abnormality. Visualized paranasal sinuses are
clear. Orbital soft tissues unremarkable.
IMPRESSION: Numerous ill-defined areas of cortical low density throughout both
cerebral hemispheres and bilateral basal ganglia concerning for
multiple bilateral infarcts. The rather widespread and diffuse
nature of this raises the possibility of diffuse anoxic injury. No
hemorrhage.
